# Patient Record
Sex: Female | Born: 1990 | Race: Black or African American | Hispanic: No | Marital: Single | Smoking: Never smoker
Health system: Southern US, Community
[De-identification: ages and names within clinical notes are randomized; demographics above are authoritative.]

## PROBLEM LIST (undated history)

## (undated) ENCOUNTER — Inpatient Hospital Stay (HOSPITAL_COMMUNITY): Payer: Self-pay

## (undated) ENCOUNTER — Inpatient Hospital Stay (HOSPITAL_COMMUNITY): Admission: RE | Payer: No Typology Code available for payment source | Source: Ambulatory Visit

## (undated) DIAGNOSIS — J302 Other seasonal allergic rhinitis: Secondary | ICD-10-CM

## (undated) DIAGNOSIS — R51 Headache: Secondary | ICD-10-CM

## (undated) DIAGNOSIS — K219 Gastro-esophageal reflux disease without esophagitis: Secondary | ICD-10-CM

## (undated) DIAGNOSIS — F329 Major depressive disorder, single episode, unspecified: Secondary | ICD-10-CM

## (undated) DIAGNOSIS — R519 Headache, unspecified: Secondary | ICD-10-CM

## (undated) HISTORY — PX: KNEE SURGERY: SHX244

## (undated) HISTORY — DX: Other seasonal allergic rhinitis: J30.2

---
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
## 2011-02-14 ENCOUNTER — Emergency Department (HOSPITAL_COMMUNITY)
Admission: EM | Admit: 2011-02-14 | Discharge: 2011-02-14 | Disposition: A | Discharge status: Home / Self Care | Payer: Medicaid Other | Attending: Emergency Medicine | Admitting: Emergency Medicine

## 2011-02-14 DIAGNOSIS — R42 Dizziness and giddiness: Secondary | ICD-10-CM | POA: Insufficient documentation

## 2011-02-14 DIAGNOSIS — N898 Other specified noninflammatory disorders of vagina: Secondary | ICD-10-CM | POA: Insufficient documentation

## 2011-02-14 DIAGNOSIS — R209 Unspecified disturbances of skin sensation: Secondary | ICD-10-CM | POA: Insufficient documentation

## 2011-02-14 LAB — POCT I-STAT, CHEM 8
BUN: 13 mg/dL (ref 6–23)
Creatinine, Ser: 0.8 mg/dL (ref 0.50–1.10)
Sodium: 142 mEq/L (ref 135–145)
TCO2: 24 mmol/L (ref 0–100)

## 2011-02-14 LAB — URINALYSIS, ROUTINE W REFLEX MICROSCOPIC
Bilirubin Urine: NEGATIVE
Ketones, ur: 15 mg/dL — AB
Nitrite: NEGATIVE
Protein, ur: NEGATIVE mg/dL
pH: 6 (ref 5.0–8.0)

## 2011-02-14 LAB — URINE MICROSCOPIC-ADD ON

## 2011-02-15 LAB — GC/CHLAMYDIA PROBE AMP, GENITAL
Chlamydia, DNA Probe: NEGATIVE
GC Probe Amp, Genital: NEGATIVE

## 2011-04-22 ENCOUNTER — Encounter: Payer: Self-pay | Admitting: *Deleted

## 2011-04-22 ENCOUNTER — Emergency Department (HOSPITAL_COMMUNITY)
Admission: EM | Admit: 2011-04-22 | Discharge: 2011-04-22 | Disposition: A | Discharge status: Home / Self Care | Payer: Medicaid Other | Attending: Emergency Medicine | Admitting: Emergency Medicine

## 2011-04-22 DIAGNOSIS — G8929 Other chronic pain: Secondary | ICD-10-CM | POA: Insufficient documentation

## 2011-04-22 DIAGNOSIS — T7840XA Allergy, unspecified, initial encounter: Secondary | ICD-10-CM | POA: Insufficient documentation

## 2011-04-22 DIAGNOSIS — L299 Pruritus, unspecified: Secondary | ICD-10-CM | POA: Insufficient documentation

## 2011-04-22 DIAGNOSIS — Z9889 Other specified postprocedural states: Secondary | ICD-10-CM | POA: Insufficient documentation

## 2011-04-22 DIAGNOSIS — M25569 Pain in unspecified knee: Secondary | ICD-10-CM | POA: Insufficient documentation

## 2011-04-22 DIAGNOSIS — I10 Essential (primary) hypertension: Secondary | ICD-10-CM | POA: Insufficient documentation

## 2011-04-22 DIAGNOSIS — L259 Unspecified contact dermatitis, unspecified cause: Secondary | ICD-10-CM | POA: Insufficient documentation

## 2011-04-22 MED ORDER — LORATADINE 10 MG PO TABS: 10.0000 mg | ORAL_TABLET | Freq: Every day | ORAL | Status: DC

## 2011-04-22 MED ORDER — DIPHENHYDRAMINE HCL 25 MG PO TABS: 50.0000 mg | ORAL_TABLET | ORAL | Status: DC | PRN

## 2011-04-22 MED ORDER — PREDNISONE 20 MG PO TABS: ORAL_TABLET | ORAL | Status: AC

## 2011-04-22 MED ORDER — FAMOTIDINE 20 MG PO TABS: 20.0000 mg | ORAL_TABLET | Freq: Two times a day (BID) | ORAL | Status: DC

## 2011-04-22 NOTE — Discharge Instructions (Signed)
Allergic Reaction, Mild to Moderate Allergies may happen from anything your body is sensitive to. This may be food, medications, pollens, chemicals, and nearly anything around you in everyday life that produces allergens. An allergen is anything that causes an allergy producing substance. Allergens cause your body to release allergic antibodies. Through a chain of events, they cause a release of histamine into the blood stream. Histamines are meant to protect you, but they also cause your discomfort. This is why antihistamines are often used for allergies. Heredity is often a factor in causing allergic reactions. This means you may have some of the same allergies as your parents. Allergies happen in all age groups. You may have some idea of what caused your reaction. There are many allergens around [place]. It may be difficult to know what caused your reaction. If this is a first time event, it may never happen again. Allergies cannot be cured but can be controlled with medications. SYMPTOMS  You may get some or all of the following problems from allergies.  Swelling and itching in and around the mouth.   Tearing, itchy eyes.   Nasal congestion and runny nose.   Sneezing and coughing.   An itchy red rash or hives.   Vomiting or diarrhea.   Difficulty breathing.  Seasonal allergies occur in all age groups. They are seasonal because they usually occur during the same season every year. They may be a reaction to molds, grass pollens, or tree pollens. Other causes of allergies are house dust mite allergens, pet dander and mold spores. These are just a common few of the thousands of allergens around [place]. All of the symptoms listed above happen when you come in contact with pollens and other allergens. Seasonal allergies are usually not life threatening. They are generally more of a nuisance that can often be handled using medications. Hay fever is a combination of all or some of the above listed allergy  problems. It may often be treated with simple over-the-counter medications such as diphenhydramine. Take medication as directed. Check with your caregiver or package insert for child dosages. TREATMENT AND HOME CARE INSTRUCTIONS If hives or rash are present:  Take medications as directed.   You may use an over-the-counter antihistamine (diphenhydramine) for hives and itching as needed. Do not drive or drink alcohol until medications used to treat the reaction have worn off. Antihistamines tend to make people sleepy.   Apply cold cloths (compresses) to the skin or take baths in cool water. This will help itching. Avoid hot baths or showers. Heat will make a rash and itching worse.   If your allergies persist and become more severe, and over the counter medications are not effective, there are many new medications your caretaker can prescribe. Immunotherapy or desensitizing injections can be used if all else fails. Follow up with your caregiver if problems continue.  SEEK MEDICAL CARE IF:   Your allergies are becoming progressively more troublesome.   You suspect a food allergy. Symptoms generally happen within 30 minutes of eating a food.   Your symptoms have not gone away within 2 days or are getting worse.   You develop new symptoms.   You want to retest yourself or your child with a food or drink you think causes an allergic reaction. Never test yourself or your child of a suspected allergy without being under the watchful eye of your caregivers. A second exposure to an allergen may be life-threatening.  SEEK IMMEDIATE MEDICAL CARE IF:  You  develop difficulty breathing or wheezing, or have a tight feeling in your chest or throat.   You develop a swollen mouth, hives, swelling, or itching all over your body.  A severe reaction with any of the above problems should be considered life-threatening. If you suddenly develop difficulty breathing call for local emergency medical help. THIS IS AN  EMERGENCY. MAKE SURE YOU:   Understand these instructions.   Will watch your condition.   Will get help right away if you are not doing well or get worse.  Document Released: 02/25/2007 Document Revised: 01/10/2011 Document Reviewed: 02/25/2007 [facility] Patient Information 2012 [place], [place].  Your caregiver has diagnosed you as having hives.  If you know what caused the hives, avoid that trigger. Your primary caregiver may recommend that you see an allergy specialist to determine your cause(s). SEEK MEDICAL ATTENTION IF: You still have considerable itching after taking the medication (prescribed or purchased over the counter) for 24 hours.  A temperature above 100.4 develops.  You have any pain or swelling in your joints.  Your hives last more than 1 week.  You develop new and unexplained symptoms (problems). SEEK IMMEDIATE MEDICAL ATTENTION IF: You have swollen lips or tongue.

## 2011-04-22 NOTE — ED Notes (Signed)
Pt began having a rash today after using a new soap.  Pt also would like her left knee evaluated due to pain (she had surgery to this knee in the past).  Pt is ambulatory in triage

## 2011-04-22 NOTE — ED Provider Notes (Signed)
History     CSN: [id number] Arrival date & time: 04/22/2011 12:53 AM   None     Chief Complaint  Patient presents with  . Allergic Reaction  . Knee Pain    (Consider location/radiation/quality/duration/timing/severity/associated sxs/prior treatment) HPI This 20 year old female complains of a few hours of generalized itching with some mild urticaria on her forearms which may have been precipitated by a new soap this evening. She has no tongue swelling no stridor no drooling no shortness of breath no chest pain no abdominal pain no nausea or vomiting. She has some chronic bilateral knee pain for the last couple years and will follow up with her doctor for that, that the pain is chronic and ongoing and is not a new problem tonight in all areas she has no trauma to her knees recently and no redness or swelling to her knees no weakness or numbness to her legs. There has been no treatment prior to arrival for her itching. She drove herself to the emergency department his driver self home as well. History reviewed. No pertinent past medical history.  Past Surgical History  Procedure Date  . Knee surgery     No family history on file.  History  Substance Use Topics  . Smoking status: Not on file  . Smokeless tobacco: Not on file  . Alcohol Use: No    OB History    Grav Para Term Preterm Abortions TAB SAB Ect Mult Living                  Review of Systems  Constitutional: Negative for fever.       10 Systems reviewed and are negative for acute change except as noted in the HPI.  HENT: Negative for congestion.   Eyes: Negative for discharge and redness.  Respiratory: Negative for cough and shortness of breath.   Cardiovascular: Negative for chest pain.  Gastrointestinal: Negative for vomiting and abdominal pain.  Musculoskeletal: Positive for arthralgias. Negative for back pain.  Skin: Positive for rash.  Neurological: Negative for syncope, numbness and headaches.    Psychiatric/Behavioral:       No behavior change.    Allergies  Penicillins  Home Medications   Current Outpatient Rx  Name Route Sig Dispense Refill  . DIPHENHYDRAMINE HCL 25 MG PO TABS Oral Take 2 tablets (50 mg total) by mouth every 4 (four) hours as needed for itching. 20 tablet 0  . FAMOTIDINE 20 MG PO TABS Oral Take 1 tablet (20 mg total) by mouth 2 (two) times daily. 10 tablet 0  . LORATADINE 10 MG PO TABS Oral Take 1 tablet (10 mg total) by mouth daily. 5 tablet 0  . PREDNISONE 20 MG PO TABS  2 tabs po daily x 3 days 6 tablet 0    BP 135/83  Pulse 97  Temp(Src) 98.9 F (37.2 C) (Oral)  Resp 18  SpO2 98%  Physical Exam  Nursing note and vitals reviewed. Constitutional:       Awake, alert, nontoxic appearance.  HENT:  Head: Atraumatic.  Mouth/Throat: Oropharynx is clear and moist.  Eyes: Right eye exhibits no discharge. Left eye exhibits no discharge.  Neck: Neck supple.  Pulmonary/Chest: Effort normal. She exhibits no tenderness.  Abdominal: Soft. There is no tenderness. There is no rebound.  Musculoskeletal: She exhibits tenderness. She exhibits no edema.       Baseline ROM, no obvious new focal weakness.  Both knees are mildly tender with good range of motion and negative  McMurray's and Lachman's testing and no instability with varus or valgus stress testing.  Neurological: She is alert.       Mental status and motor strength appears baseline for patient and situation.  Skin: Rash noted.       The patient has generalized pruritus but the only rash noted is some very mild urticarial plaques on both forearms without tenderness  Psychiatric: She has a normal mood and affect.    ED Course  Procedures (including critical care time)  Labs Reviewed - No data to display No results found.   1. Allergic reaction       MDM  I doubt any other EMC precluding discharge at this time including, but not necessarily limited to the following:anaphylaxis,  SBI.        Hurman Horn, MD 04/22/11 [phone]

## 2011-05-20 ENCOUNTER — Emergency Department (HOSPITAL_COMMUNITY)
Admission: EM | Admit: 2011-05-20 | Discharge: 2011-05-20 | Disposition: A | Discharge status: Home / Self Care | Payer: Self-pay | Attending: Emergency Medicine | Admitting: Emergency Medicine

## 2011-05-20 ENCOUNTER — Encounter (HOSPITAL_COMMUNITY): Payer: Self-pay | Admitting: *Deleted

## 2011-05-20 DIAGNOSIS — R109 Unspecified abdominal pain: Secondary | ICD-10-CM | POA: Insufficient documentation

## 2011-05-20 DIAGNOSIS — R300 Dysuria: Secondary | ICD-10-CM | POA: Insufficient documentation

## 2011-05-20 DIAGNOSIS — N390 Urinary tract infection, site not specified: Secondary | ICD-10-CM | POA: Insufficient documentation

## 2011-05-20 LAB — URINALYSIS, ROUTINE W REFLEX MICROSCOPIC
Bilirubin Urine: NEGATIVE
Nitrite: NEGATIVE
Specific Gravity, Urine: 1.026 (ref 1.005–1.030)
Urobilinogen, UA: 0.2 mg/dL (ref 0.0–1.0)

## 2011-05-20 LAB — URINE MICROSCOPIC-ADD ON

## 2011-05-20 MED ORDER — PHENAZOPYRIDINE HCL 100 MG PO TABS
200.0000 mg | ORAL_TABLET | Freq: Once | ORAL | Status: AC
  Administered 2011-05-20: 200 mg via ORAL
  Filled 2011-05-20: qty 2

## 2011-05-20 MED ORDER — NITROFURANTOIN MONOHYD MACRO 100 MG PO CAPS: 100.0000 mg | ORAL_CAPSULE | Freq: Two times a day (BID) | ORAL | Status: AC

## 2011-05-20 MED ORDER — NITROFURANTOIN MONOHYD MACRO 100 MG PO CAPS
100.0000 mg | ORAL_CAPSULE | ORAL | Status: AC
  Administered 2011-05-20: 100 mg via ORAL
  Filled 2011-05-20: qty 1

## 2011-05-20 MED ORDER — ONDANSETRON 4 MG PO TBDP
8.0000 mg | ORAL_TABLET | Freq: Once | ORAL | Status: AC
  Administered 2011-05-20: 8 mg via ORAL
  Filled 2011-05-20: qty 2

## 2011-05-20 MED ORDER — PHENAZOPYRIDINE HCL 200 MG PO TABS: 200.0000 mg | ORAL_TABLET | Freq: Three times a day (TID) | ORAL | Status: AC | PRN

## 2011-05-20 NOTE — ED Provider Notes (Signed)
History     CSN: [id number]  Arrival date & time 05/20/11  1538   First MD Initiated Contact with Patient 05/20/11 1704      Chief Complaint  Patient presents with  . Urinary Tract Infection    (Consider location/radiation/quality/duration/timing/severity/associated sxs/prior treatment) Patient is a 21 y.o. female presenting with female genitourinary complaint. The history is provided by the patient. No language interpreter was used.  Female GU Problem Primary symptoms include dysuria.  Primary symptoms include no discharge, no pelvic pain, no dyspareunia, no genital itching, no genital odor and no vaginal bleeding. There has been no fever. This is a new problem. The current episode started 6 to 12 hours ago. The problem occurs constantly. The problem has not changed since onset.The symptoms occur during urination and after urination. She is not pregnant. She has not missed her period. The patient's menstrual history has been regular. The discharge was normal. Associated symptoms include abdominal pain (Mild, lower). Pertinent negatives include no nausea and no vomiting. She has tried nothing for the symptoms. The treatment provided no relief. There is no concern regarding sexually transmitted diseases.    History reviewed. No pertinent past medical history.  Past Surgical History  Procedure Date  . Knee surgery     History reviewed. No pertinent family history.  History  Substance Use Topics  . Smoking status: Not on file  . Smokeless tobacco: Not on file  . Alcohol Use: No    OB History    Grav Para Term Preterm Abortions TAB SAB Ect Mult Living                  Review of Systems  Constitutional: Negative for fever and chills.  Respiratory: Negative for cough and shortness of breath.   Gastrointestinal: Positive for abdominal pain (Mild, lower). Negative for nausea, vomiting and abdominal distention.  Genitourinary: Positive for dysuria. Negative for vaginal bleeding,  pelvic pain and dyspareunia.  All other systems reviewed and are negative.    Allergies  Penicillins and Pomegranate extract  Home Medications   Current Outpatient Rx  Name Route Sig Dispense Refill  . NITROFURANTOIN MONOHYD MACRO 100 MG PO CAPS Oral Take 1 capsule (100 mg total) by mouth 2 (two) times daily. 6 capsule 0  . PHENAZOPYRIDINE HCL 200 MG PO TABS Oral Take 1 tablet (200 mg total) by mouth 3 (three) times daily as needed for pain. 9 tablet 0    BP 104/66  Pulse 75  Temp(Src) 98.7 F (37.1 C) (Oral)  Resp 20  SpO2 100%  Physical Exam  Nursing note and vitals reviewed. Constitutional: She is oriented to person, place, and time. She appears well-developed and well-nourished. No distress.  HENT:  Head: Normocephalic and atraumatic.  Eyes: EOM are normal. Pupils are equal, round, and reactive to light.  Neck: Normal range of motion. Neck supple.  Cardiovascular: Normal rate and regular rhythm.  Exam reveals no friction rub.   No murmur heard. Pulmonary/Chest: Effort normal and breath sounds normal. No respiratory distress. She has no wheezes. She has no rales.  Abdominal: Soft. She exhibits no distension. There is no tenderness. There is no rebound.  Musculoskeletal: Normal range of motion. She exhibits no edema.  Neurological: She is alert and oriented to person, place, and time.  Skin: She is not diaphoretic.    ED Course  Procedures (including critical care time)  Labs Reviewed  URINALYSIS, ROUTINE W REFLEX MICROSCOPIC - Abnormal; Notable for the following:    APPearance  CLOUDY (*)    Hgb urine dipstick SMALL (*)    Leukocytes, UA MODERATE (*)    All other components within normal limits  URINE MICROSCOPIC-ADD ON - Abnormal; Notable for the following:    Squamous Epithelial / LPF FEW (*)    Bacteria, UA FEW (*)    All other components within normal limits  POCT PREGNANCY, URINE  POCT PREGNANCY, URINE   No results found.   1. Urinary tract infection         MDM  21 year old female presenting with dysuria and urinary urgency that began this morning. Denies fevers, nausea, vomiting, vaginal bleeding or discharge. Has mild suprapubic pain. Patient is afebrile and vital signs are stable. Mild suprapubic tenderness otherwise exam is benign.  Patient has an uncomplicated cystitis. Urinary studies show bacteria and white cells in her urine. UA shows moderate leukocyte esterase. Pyridium given here in the emergency department.  Patient given Macrobid and peridium prescription to go home with. Given resources for PCP followup.   Elwin Mocha, MD 05/20/11 1729

## 2011-05-20 NOTE — ED Notes (Signed)
The pt has had urinary frequency since this am and she was at work and had to leave.  Lt lower abd pain.  lmp unknown for a long tiime

## 2011-05-20 NOTE — ED Provider Notes (Signed)
I saw and evaluated the patient, reviewed the resident's note and I agree with the findings and plan.  Aurel Nguyen P Casey Fye, MD 05/20/11 2325 

## 2011-05-20 NOTE — ED Notes (Signed)
Pt presents with 1 day hx of urinary issues with frequency, urgency, and pain. No meds pta. Alert and oriented.

## 2011-09-05 ENCOUNTER — Ambulatory Visit: Payer: Self-pay | Admitting: Family Medicine

## 2011-09-05 ENCOUNTER — Ambulatory Visit: Payer: Self-pay

## 2011-09-05 DIAGNOSIS — N390 Urinary tract infection, site not specified: Secondary | ICD-10-CM

## 2011-09-05 DIAGNOSIS — M549 Dorsalgia, unspecified: Secondary | ICD-10-CM

## 2011-09-05 DIAGNOSIS — R350 Frequency of micturition: Secondary | ICD-10-CM

## 2011-09-05 LAB — POCT UA - MICROSCOPIC ONLY
Bacteria, U Microscopic: NEGATIVE
Casts, Ur, LPF, POC: NEGATIVE
Mucus, UA: NEGATIVE

## 2011-09-05 LAB — POCT URINALYSIS DIPSTICK
Bilirubin, UA: NEGATIVE
Ketones, UA: NEGATIVE
Leukocytes, UA: NEGATIVE
Spec Grav, UA: 1.02
pH, UA: 7

## 2011-09-05 MED ORDER — TRAMADOL HCL 50 MG PO TABS: 50.0000 mg | ORAL_TABLET | Freq: Three times a day (TID) | ORAL | Status: AC | PRN

## 2011-09-05 MED ORDER — DICLOFENAC SODIUM 75 MG PO TBEC: 75.0000 mg | DELAYED_RELEASE_TABLET | Freq: Two times a day (BID) | ORAL | Status: DC

## 2011-09-05 MED ORDER — CIPROFLOXACIN HCL 250 MG PO TABS: 250.0000 mg | ORAL_TABLET | Freq: Two times a day (BID) | ORAL | Status: AC

## 2011-09-05 NOTE — Progress Notes (Signed)
21 yo woman with acute midthoracic back pain today when getting out of the shower.  No prior hx of this kind of pain.  No leg pains.  No nausea.  Pain worse with arm movement.  Also c/o urinary frequency and urinary discomfort x 2 days.  Had UTI one month ago.  O:  NAD Chest:  Clear Heart: reg, no murmur or gallop,  Normal rate. Back:  Tender left rhomboids. Results for orders placed in visit on 09/05/11  POCT URINALYSIS DIPSTICK      Component Value Range   Color, UA ywllow     Clarity, UA clear     Glucose, UA neg     Bilirubin, UA neg     Ketones, UA neg     Spec Grav, UA 1.020     Blood, UA trace-intact     pH, UA 7.0     Protein, UA 30mg      Urobilinogen, UA 0.2     Nitrite, UA neg     Leukocytes, UA Negative    POCT UA - MICROSCOPIC ONLY      Component Value Range   WBC, Ur, HPF, POC 5-15     RBC, urine, microscopic 0-2     Bacteria, U Microscopic neg     Mucus, UA neg     Epithelial cells, urine per micros 0-2     Crystals, Ur, HPF, POC neg     Casts, Ur, LPF, POC neg     Yeast, UA neg    POCT URINE PREGNANCY      Component Value Range   Preg Test, Ur         [facility] reading (PRIMARY) by  Dr. Milus Glazier CXR.  No bony or pulmonary abnormality  A:  Chest pain

## 2011-09-05 NOTE — Patient Instructions (Signed)

## 2011-09-08 LAB — URINE CULTURE: Colony Count: 45000

## 2011-11-14 ENCOUNTER — Emergency Department (HOSPITAL_COMMUNITY)
Admission: EM | Admit: 2011-11-14 | Discharge: 2011-11-14 | Disposition: A | Discharge status: Home / Self Care | Payer: Self-pay | Source: Home / Self Care | Attending: Emergency Medicine | Admitting: Emergency Medicine

## 2011-11-14 ENCOUNTER — Encounter (HOSPITAL_COMMUNITY): Payer: Self-pay | Admitting: Emergency Medicine

## 2011-11-14 DIAGNOSIS — J039 Acute tonsillitis, unspecified: Secondary | ICD-10-CM

## 2011-11-14 LAB — POCT RAPID STREP A: Streptococcus, Group A Screen (Direct): NEGATIVE

## 2011-11-14 LAB — POCT INFECTIOUS MONO SCREEN: Mono Screen: NEGATIVE

## 2011-11-14 MED ORDER — AZITHROMYCIN 500 MG PO TABS: 500.0000 mg | ORAL_TABLET | Freq: Every day | ORAL | Status: AC

## 2011-11-14 MED ORDER — PREDNISONE 5 MG PO KIT: 1.0000 | PACK | Freq: Every day | ORAL | Status: DC

## 2011-11-14 NOTE — ED Provider Notes (Signed)
Chief Complaint  Patient presents with  . Sore Throat    History of Present Illness:   The patient is a 21 year old female who has had a two-day history of sore throat, pain on swallowing, feels weak, achy, had a headache, felt feverish, chills, sweats, and had swollen glands she denies any nasal congestion, rhinorrhea, cough, nausea, or vomiting. She has no known exposure to strep or to mono.  Review of Systems:  Other than as noted above, the patient denies any of the following symptoms. Systemic:  No fever, chills, sweats, fatigue, myalgias, headache, or anorexia. Eye:  No redness, pain or drainage. ENT:  No earache, ear congestion, nasal congestion, sneezing, rhinorrhea, sinus pressure, sinus pain, or post nasal drip. Lungs:  No cough, sputum production, wheezing, shortness of breath, or chest pain. GI:  No abdominal pain, nausea, vomiting, or diarrhea. Skin:  No rash or itching.  PMFSH:  Past medical history, family history, social history, meds, allergies, and nurse's notes were reviewed.  There is no known exposure to strep or mono.  No prior history of step or mono.  The patient denies use of tobacco.  Physical Exam:   Vital signs:  BP 146/99  Pulse 120  Temp 98.5 F (36.9 C) (Oral)  Resp 22  Wt 119 lb (53.978 kg)  SpO2 98% General:  Alert, in no distress. Eye:  No conjunctival injection or drainage. Lids were normal. ENT:  TMs and canals were normal, without erythema or inflammation.  Nasal mucosa was clear and uncongested, without drainage.  Mucous membranes were moist.  Exam of pharynx reveals tonsils enlarged and red with spots of white exudate.  There were no oral ulcerations or lesions. Neck:  Supple, no adenopathy, tenderness or mass. Lungs:  No respiratory distress.  Lungs were clear to auscultation, without wheezes, rales or rhonchi.  Breath sounds were clear and equal bilaterally.  Heart:  Regular rhythm, without gallops, murmers or rubs. Skin:  Clear, warm, and dry,  without rash or lesions.  Labs:   Results for orders placed during the hospital encounter of 11/14/11  POCT RAPID STREP A ([facility] CARE ONLY)      Component Value Range   Streptococcus, Group A Screen (Direct) NEGATIVE  NEGATIVE  POCT INFECTIOUS MONO SCREEN      Component Value Range   Mono Screen NEGATIVE  NEGATIVE    Assessment:  The encounter diagnosis was Tonsillitis.  Plan:   1.  The following meds were prescribed:   New Prescriptions   AZITHROMYCIN (ZITHROMAX) 500 MG TABLET    Take 1 tablet (500 mg total) by mouth daily.   PREDNISONE 5 MG KIT    Take 1 kit (5 mg total) by mouth daily after breakfast. Prednisone 5 mg 6 day dosepack.  Take as directed.   2.  The patient was instructed in symptomatic care including hot saline gargles, throat lozenges, infectious precautions, and need to trade out toothbrush. Handouts were given. 3.  The patient was told to return if becoming worse in any way, if no better in 3 or 4 days, and given some red flag symptoms that would indicate earlier return.    Reuben Likes, MD 11/14/11 [phone]

## 2011-11-14 NOTE — ED Notes (Signed)
Sore throat pain, general body aches chest pain, headache.  Chest pain hurts with breathing and with swallowing

## 2011-12-13 ENCOUNTER — Encounter: Payer: Self-pay | Admitting: Cardiology

## 2011-12-13 ENCOUNTER — Ambulatory Visit (INDEPENDENT_AMBULATORY_CARE_PROVIDER_SITE_OTHER): Payer: BC Managed Care – PPO | Admitting: Cardiology

## 2011-12-13 VITALS — BP 108/70 | HR 91 | Ht 61.0 in | Wt 221.0 lb

## 2011-12-13 DIAGNOSIS — R42 Dizziness and giddiness: Secondary | ICD-10-CM

## 2011-12-13 DIAGNOSIS — R55 Syncope and collapse: Secondary | ICD-10-CM

## 2011-12-13 NOTE — Patient Instructions (Signed)
Stay well hydrated. Liberalize salt in your diet.  Avoid prolonged exposure to heat.   If you feel really lightheaded, lie down until symptoms pass.

## 2011-12-13 NOTE — Progress Notes (Signed)
   Domenic Polite Date of Birth: Aug 26, 1990 Medical Record #[id number]  History of Present Illness: Marbeth is a pleasant 21 year old white female seen at the request of Dr. Dallas Schimke for evaluation of syncope. She has been in excellent health. She reports that recently she was on vacation. She was in a Jacuzzi for over 15 minutes when she suddenly felt that she couldn't breathe and became lightheaded. She felt that she was going to pass out. She went to take a cold shower but then she passed out. This resulted in her hitting her knee against the tub. Since then she feels a little bit weak and mildly lightheaded. She notes that since her pregnancy 2 years ago she has had a tendency for low blood pressure with readings in the 90s systolic. She has no history of cardiac disease or murmur. She's had no prior syncopal episodes.  No current outpatient prescriptions on file prior to visit.    Allergies  Allergen Reactions  . Penicillins Other (See Comments)    Unknown from childhood  . Pomegranate Extract Hives and Itching    Past Medical History  Diagnosis Date  . Back pain   . UTI (lower urinary tract infection)   . Chest pain   . Gestational diabetes     Past Surgical History  Procedure Date  . Knee surgery     History  Smoking status  . Never Smoker   Smokeless tobacco  . Not on file    History  Alcohol Use No    History reviewed. No pertinent family history.  Review of Systems: The review of systems is positive for some headache since her fall.  All other systems were reviewed and are negative.  Physical Exam: BP 108/70  Pulse 91  Ht 5\' 1"  (1.549 m)  Wt 100.245 kg (221 lb)  BMI 41.76 kg/m2  SpO2 97% she has no orthostasis on standing She is an obese black female in no acute distress.The patient is alert and oriented x 3.  The mood and affect are normal.  The skin is warm and dry.  Color is normal.  The HEENT exam reveals that the sclera are nonicteric.  The mucous  membranes are moist.  The carotids are 2+ without bruits.  There is no thyromegaly.  There is no JVD.  The lungs are clear.  The chest wall is non tender.  The heart exam reveals a regular rate with a normal S1 and S2.  There are no murmurs, gallops, or rubs.  The PMI is not displaced.   Abdominal exam reveals good bowel sounds.  There is no guarding or rebound.  There is no hepatosplenomegaly or tenderness.  There are no masses.  Exam of the legs reveal no clubbing, cyanosis, or edema.  The legs are without rashes.  The distal pulses are intact.  Cranial nerves II - XII are intact.  Motor and sensory functions are intact.  The gait is normal.  LABORATORY DATA: ECG today shows normal sinus rhythm with a normal ECG. QT interval is normal.  Assessment / Plan:

## 2012-02-15 DIAGNOSIS — B358 Other dermatophytoses: Secondary | ICD-10-CM | POA: Insufficient documentation

## 2012-02-16 ENCOUNTER — Emergency Department (HOSPITAL_COMMUNITY)
Admission: EM | Admit: 2012-02-16 | Discharge: 2012-02-16 | Disposition: A | Discharge status: Home / Self Care | Payer: BC Managed Care – PPO | Attending: Emergency Medicine | Admitting: Emergency Medicine

## 2012-02-16 ENCOUNTER — Encounter (HOSPITAL_COMMUNITY): Payer: Self-pay | Admitting: *Deleted

## 2012-02-16 DIAGNOSIS — B356 Tinea cruris: Secondary | ICD-10-CM

## 2012-02-16 LAB — URINALYSIS, ROUTINE W REFLEX MICROSCOPIC
Bilirubin Urine: NEGATIVE
Ketones, ur: NEGATIVE mg/dL
Nitrite: NEGATIVE
Urobilinogen, UA: 0.2 mg/dL (ref 0.0–1.0)

## 2012-02-16 MED ORDER — CLOTRIMAZOLE 1 % EX CREA: TOPICAL_CREAM | CUTANEOUS | Status: DC

## 2012-02-16 NOTE — ED Notes (Addendum)
Pt reports vaginal rash and itching x1 week. Pt denies vaginal odor, d/c, or bleeding. Pt reports using OTC creams w/no relief

## 2012-02-16 NOTE — ED Notes (Signed)
MD at bedside. EP [employer]

## 2012-02-16 NOTE — ED Notes (Addendum)
C/o vaginal rash, describes as some itching, d/c, and "it hurts". First noticed 1 week ago. (Denies: nvd, fever or urinary sx).  "Thinks the rash is from her razor & not anything else, it is just irritating".

## 2012-02-16 NOTE — ED Provider Notes (Signed)
History     CSN: [id number]  Arrival date & time 02/15/12  2341   First MD Initiated Contact with Patient 02/16/12 [phone]      Chief Complaint  Patient presents with  . Rash    (Consider location/radiation/quality/duration/timing/severity/associated sxs/prior treatment) HPI Comments: 21 year old female with no significant past medical history who presents with a complaint of a rash in her groin. She states that this started approximately one week ago and was mild but has become more painful and itchy over the last week. Symptoms are persistent, nothing makes better or worse, has tried topical preparations including Vaseline which have not helped. She denies dysuria, vaginal discharge, abdominal pain, diarrhea.  Patient is a 21 y.o. female presenting with rash. The history is provided by the patient.  Rash     Past Medical History  Diagnosis Date  . Back pain   . UTI (lower urinary tract infection)   . Chest pain   . Gestational diabetes   . Low blood pressure     Past Surgical History  Procedure Date  . Knee surgery   . Knee surgery     No family history on file.  History  Substance Use Topics  . Smoking status: Never Smoker   . Smokeless tobacco: Not on file  . Alcohol Use: No    OB History    Grav Para Term Preterm Abortions TAB SAB Ect Mult Living                  Review of Systems  Constitutional: Negative for fever and chills.  Gastrointestinal: Negative for nausea, vomiting, abdominal pain and diarrhea.  Genitourinary: Positive for vaginal pain. Negative for dysuria and vaginal discharge.  Skin: Positive for rash.    Allergies  Penicillins and Pomegranate extract  Home Medications   Current Outpatient Rx  Name Route Sig Dispense Refill  . TRAMADOL HCL 50 MG PO TABS Oral Take 100 mg by mouth 2 (two) times daily as needed.    Marland Kitchen CLOTRIMAZOLE 1 % EX CREA  Apply to affected area twice daily 15 g 1    BP 145/57  Pulse 80  Temp 98.9 F (37.2 C)  (Oral)  Resp 18  SpO2 99%  Physical Exam  Nursing note and vitals reviewed. Constitutional: She appears well-developed and well-nourished. No distress.  HENT:  Head: Normocephalic and atraumatic.  Eyes: Conjunctivae normal are normal. Right eye exhibits no discharge. Left eye exhibits no discharge. No scleral icterus.  Pulmonary/Chest: Effort normal.  Abdominal: Soft. Bowel sounds are normal. She exhibits no distension and no mass. There is no tenderness.  Genitourinary:       Chaperone present for vaginal exam: Patient has erythematous pruritic, mildly scaling rash to the bilateral labium majora, tracking along the inguinal regions.  Neurological: She is alert. Coordination normal.  Skin: Skin is warm and dry. Rash noted. There is erythema.  Psychiatric: She has a normal mood and affect. Her behavior is normal.    ED Course  Procedures (including critical care time)  Labs Reviewed  URINALYSIS, ROUTINE W REFLEX MICROSCOPIC - Abnormal; Notable for the following:    Specific Gravity, Urine 1.035 (*)     All other components within normal limits   No results found.   1. Tinea cruris       MDM  The patient declines vaginal exam and STD testing stating that she will see her gynecologist next week. This rash appears to be fungal in nature, topical cream medicated, followup  with GYN.        Vida Roller, MD 02/16/12 [phone]

## 2012-02-21 ENCOUNTER — Ambulatory Visit (INDEPENDENT_AMBULATORY_CARE_PROVIDER_SITE_OTHER): Payer: BC Managed Care – PPO | Admitting: Family Medicine

## 2012-02-21 ENCOUNTER — Encounter: Payer: Self-pay | Admitting: Family Medicine

## 2012-02-21 VITALS — BP 100/78 | Temp 98.8°F | Ht 61.5 in | Wt 225.0 lb

## 2012-02-21 DIAGNOSIS — Z23 Encounter for immunization: Secondary | ICD-10-CM

## 2012-02-21 DIAGNOSIS — R739 Hyperglycemia, unspecified: Secondary | ICD-10-CM

## 2012-02-21 DIAGNOSIS — R7309 Other abnormal glucose: Secondary | ICD-10-CM

## 2012-02-21 DIAGNOSIS — E669 Obesity, unspecified: Secondary | ICD-10-CM

## 2012-02-21 LAB — BASIC METABOLIC PANEL
Chloride: 102 mEq/L (ref 96–112)
Potassium: 3.6 mEq/L (ref 3.5–5.1)
Sodium: 134 mEq/L — ABNORMAL LOW (ref 135–145)

## 2012-02-21 LAB — TSH: TSH: 1.7 u[IU]/mL (ref 0.35–5.50)

## 2012-02-21 LAB — LIPID PANEL
LDL Cholesterol: 111 mg/dL — ABNORMAL HIGH (ref 0–99)
Total CHOL/HDL Ratio: 4

## 2012-02-21 NOTE — Patient Instructions (Addendum)
-We have ordered labs or studies at this visit. It usually takes 1-2 weeks for results and processing. We will contact you with instructions IF your results are abnormal. Normal results will be released to your [facility] in 1-2 weeks. If you have not heard from [place] or can not find your results in [facility] in 2 weeks please contact our office.  -please follow diet for reflux and use TUMS if symptoms according to instructions  We recommend the following healthy lifestyle measures: - eat a healthy diet consisting of lots of vegetables, fruits, beans, nuts, seeds, healthy meats such as white chicken and fish and whole grains.  - avoid fried foods, fast food, processed foods, sodas, red meet and other fattening foods.  - get a least 150 minutes of aerobic exercise per week.    Diet for Gastroesophageal Reflux Disease, Adult Reflux (acid reflux) is when acid from your stomach flows up into the esophagus. When acid comes in contact with the esophagus, the acid causes irritation and soreness (inflammation) in the esophagus. When reflux happens often or so severely that it causes damage to the esophagus, it is called gastroesophageal reflux disease (GERD). Nutrition therapy can help ease the discomfort of GERD. FOODS OR DRINKS TO AVOID OR LIMIT  Smoking or chewing tobacco. Nicotine is one of the most potent stimulants to acid production in the gastrointestinal tract.  Caffeinated and decaffeinated coffee and black tea.  Regular or low-calorie carbonated beverages or energy drinks (caffeine-free carbonated beverages are allowed).   Strong spices, such as black pepper, white pepper, red pepper, cayenne, curry powder, and chili powder.  Peppermint or spearmint.  Chocolate.  High-fat foods, including meats and fried foods. Extra added fats including oils, butter, salad dressings, and nuts. Limit these to less than 8 tsp per day.  Fruits and vegetables if they are not tolerated, such as citrus fruits or  tomatoes.  Alcohol.  Any food that seems to aggravate your condition. If you have questions regarding your diet, call your caregiver or a registered dietitian. OTHER THINGS THAT MAY HELP GERD INCLUDE:   Eating your meals slowly, in a relaxed setting.  Eating 5 to 6 small meals per day instead of 3 large meals.  Eliminating food for a period of time if it causes distress.  Not lying down until 3 hours after eating a meal.  Keeping the head of your bed raised 6 to 9 inches (15 to 23 cm) by using a foam wedge or blocks under the legs of the bed. Lying flat may make symptoms worse.  Being physically active. Weight loss may be helpful in reducing reflux in overweight or obese adults.  Wear loose fitting clothing EXAMPLE MEAL PLAN This meal plan is approximately 2,000 calories based on https://www.bernard.org/ meal planning guidelines. Breakfast   cup cooked oatmeal.  1 cup strawberries.  1 cup low-fat milk.  1 oz almonds. Snack  1 cup cucumber slices.  6 oz yogurt (made from low-fat or fat-free milk). Lunch  2 slice whole-wheat bread.  2 oz sliced [place].  2 tsp mayonnaise.  1 cup blueberries.  1 cup snap peas. Snack  6 whole-wheat crackers.  1 oz string cheese. Dinner   cup brown rice.  1 cup mixed veggies.  1 tsp olive oil.  3 oz grilled fish. Document Released: 04/30/2005 Document Revised: 07/23/2011 Document Reviewed: 03/16/2011 [facility] Patient Information 2013 [place], [place].   Thank you for enrolling in MyChart. Please follow the instructions below to securely access your online medical  record. MyChart allows you to send messages to your doctor, view your test results, renew your prescriptions, schedule appointments, and more.  How Do I Sign Up? 1. In your Internet browser, go to [email or web address] 2. Click on the New  User? link in the Sign In box.  3. Enter your MyChart Access Code exactly as it appears below. You will not  need to use this code after you have completed the sign-up process. If you do not sign up before the expiration date, you must request a new code. MyChart Access Code: [email or web address] Expires: 03/17/2012 12:52 AM  4. Enter the last four digits of your Social Security Number (xxxx) and Date of Birth (mm/dd/yyyy) as indicated and click Next. You will be taken to the next sign-up page. 5. Create a MyChart ID. This will be your MyChart login ID and cannot be changed, so think of one that is secure and easy to remember. 6. Create a MyChart password. You can change your password at any time. 7. Enter your Password Reset Question and Answer and click Next. This can be used at a later time if you forget your password.  8. Select your communication preference, and if applicable enter your e-mail address. You will receive e-mail notification when new information is available in MyChart by choosing to receive e-mail notifications and filling in your e-mail. 9. Click Sign In. You can now view your medical record.   Additional Information If you have questions, you can email [email or web address]  [email or web address] or call [phone] to talk to our MyChart staff. Remember, MyChart is NOT to be used for urgent needs. For medical emergencies, dial 911.
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
## 2012-02-22 NOTE — Progress Notes (Signed)
Quick Note:  Left a message for return call. ______ 

## 2012-02-25 ENCOUNTER — Telehealth: Payer: Self-pay | Admitting: Family Medicine

## 2012-02-25 NOTE — Telephone Encounter (Signed)
[facility] Triage Call Report Triage Record Num: [id number] Operator: Claudie Leach Patient Name: Sandra Schultz Call Date & Time: 02/22/2012 10:36:26PM Patient Phone: [phone] PCP: Kriste Basque Patient Gender: Female PCP Fax : Patient DOB: Jul 03, 1990 [facility] Name: [facility] Reason for Call: Caller: Jackelyn/Patient; PCP: Kriste Basque (Family Practice); CB#: [phone]; Call regarding results of lab results. Caller requests results of lab results from 02/21/12. Verified in EPIC. Patient was left a message to call back to the office by Earle Gell on 02/22/12. Per note in EPIC by Dr. Selena Batten patient made aware that labs were ok and cholesterol was a little high and instructed per Dr. Elmyra Ricks note to exercise and follow a healthy diet. Caller states ok. Note sent to the office. Protocol(s) Used: Office Note Recommended Outcome per Protocol: Information Noted and Sent to Office Reason for Outcome: Caller information to office Care Advice: ~ 10/11/

## 2012-02-26 NOTE — Progress Notes (Signed)
Quick Note:  Left a message for pt to return call. ______ 

## 2012-03-25 ENCOUNTER — Encounter: Payer: BC Managed Care – PPO | Admitting: Family Medicine

## 2012-03-25 DIAGNOSIS — Z0289 Encounter for other administrative examinations: Secondary | ICD-10-CM

## 2012-03-25 NOTE — Progress Notes (Signed)
No show  This encounter was created in error - please disregard.

## 2012-03-28 ENCOUNTER — Encounter: Payer: Self-pay | Admitting: Family Medicine

## 2012-03-28 ENCOUNTER — Ambulatory Visit (INDEPENDENT_AMBULATORY_CARE_PROVIDER_SITE_OTHER): Payer: BC Managed Care – PPO | Admitting: Family Medicine

## 2012-03-28 VITALS — BP 120/80 | HR 68 | Temp 99.0°F | Wt 227.0 lb

## 2012-03-28 DIAGNOSIS — F329 Major depressive disorder, single episode, unspecified: Secondary | ICD-10-CM

## 2012-03-28 DIAGNOSIS — K219 Gastro-esophageal reflux disease without esophagitis: Secondary | ICD-10-CM

## 2012-03-28 MED ORDER — SERTRALINE HCL 50 MG PO TABS: 50.0000 mg | ORAL_TABLET | Freq: Every day | ORAL | Status: DC
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
## 2012-03-28 NOTE — Patient Instructions (Signed)
-  As we discussed, we have prescribed a new medication for you at this appointment. We discussed the common and serious potential adverse effects of this medication and you can review these and more with the pharmacist when you pick up your medication.  Please follow the instructions for use carefully and notify us immediately if you have any problems taking this medication.  -please call and schedule an appointment with the counselor  -please let us know immediately if any worsening of symptoms and see a doctor immediately if you are thinking of harming yourself

## 2012-04-22 ENCOUNTER — Encounter: Payer: BC Managed Care – PPO | Admitting: Family Medicine

## 2012-04-22 NOTE — Progress Notes (Signed)
No show  This encounter was created in error - please disregard.

## 2012-05-08 ENCOUNTER — Emergency Department (INDEPENDENT_AMBULATORY_CARE_PROVIDER_SITE_OTHER): Payer: BC Managed Care – PPO

## 2012-05-08 ENCOUNTER — Encounter (HOSPITAL_COMMUNITY): Payer: Self-pay | Admitting: *Deleted

## 2012-05-08 ENCOUNTER — Emergency Department (HOSPITAL_COMMUNITY)
Admission: EM | Admit: 2012-05-08 | Discharge: 2012-05-08 | Disposition: A | Discharge status: Home / Self Care | Payer: BC Managed Care – PPO | Source: Home / Self Care | Attending: Family Medicine | Admitting: Family Medicine

## 2012-05-08 DIAGNOSIS — S8000XA Contusion of unspecified knee, initial encounter: Secondary | ICD-10-CM

## 2012-05-08 DIAGNOSIS — S8002XA Contusion of left knee, initial encounter: Secondary | ICD-10-CM

## 2012-05-08 MED ORDER — HYDROCODONE-ACETAMINOPHEN 5-325 MG PO TABS
1.0000 | ORAL_TABLET | Freq: Once | ORAL | Status: AC
  Administered 2012-05-08: 1 via ORAL

## 2012-05-08 MED ORDER — HYDROCODONE-ACETAMINOPHEN 5-325 MG PO TABS: 1.0000 | ORAL_TABLET | Freq: Four times a day (QID) | ORAL | Status: DC | PRN

## 2012-05-08 MED ORDER — HYDROCODONE-ACETAMINOPHEN 5-325 MG PO TABS
ORAL_TABLET | ORAL | Status: AC
  Filled 2012-05-08: qty 1

## 2012-05-08 NOTE — ED Notes (Signed)
Came in with short knee splint that she wore to work. States she had to leave early due to pain.  Feels like its trying to lock.
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
## 2012-05-08 NOTE — ED Notes (Signed)
Slipped in the kitchen on the dog's water @ 0200 and landed on both knees.  She was dancing and wearing high heels when fall occurred.  L hurts worse than the R.   Had L meniscus repair 1 yr ago.

## 2012-06-03 ENCOUNTER — Inpatient Hospital Stay (HOSPITAL_COMMUNITY): Payer: BC Managed Care – PPO

## 2012-06-03 ENCOUNTER — Encounter (HOSPITAL_COMMUNITY): Payer: Self-pay

## 2012-06-03 ENCOUNTER — Inpatient Hospital Stay (HOSPITAL_COMMUNITY)
Admission: AD | Admit: 2012-06-03 | Discharge: 2012-06-03 | Disposition: A | Discharge status: Home / Self Care | Payer: BC Managed Care – PPO | Source: Ambulatory Visit | Attending: Obstetrics & Gynecology | Admitting: Obstetrics & Gynecology

## 2012-06-03 DIAGNOSIS — N946 Dysmenorrhea, unspecified: Secondary | ICD-10-CM

## 2012-06-03 DIAGNOSIS — R109 Unspecified abdominal pain: Secondary | ICD-10-CM | POA: Insufficient documentation

## 2012-06-03 HISTORY — DX: Major depressive disorder, single episode, unspecified: F32.9

## 2012-06-03 LAB — WET PREP, GENITAL
Trich, Wet Prep: NONE SEEN
Yeast Wet Prep HPF POC: NONE SEEN

## 2012-06-03 LAB — URINALYSIS, ROUTINE W REFLEX MICROSCOPIC
Bilirubin Urine: NEGATIVE
Glucose, UA: NEGATIVE mg/dL
Ketones, ur: NEGATIVE mg/dL
Leukocytes, UA: NEGATIVE
Nitrite: NEGATIVE
Protein, ur: NEGATIVE mg/dL
Specific Gravity, Urine: 1.01 (ref 1.005–1.030)
Urobilinogen, UA: 0.2 mg/dL (ref 0.0–1.0)
pH: 8 (ref 5.0–8.0)

## 2012-06-03 LAB — CBC
HCT: 37.8 % (ref 36.0–46.0)
Hemoglobin: 12.7 g/dL (ref 12.0–15.0)
MCH: 29.1 pg (ref 26.0–34.0)
MCHC: 33.6 g/dL (ref 30.0–36.0)
MCV: 86.7 fL (ref 78.0–100.0)
Platelets: 344 10*3/uL (ref 150–400)
RBC: 4.36 MIL/uL (ref 3.87–5.11)
RDW: 12.9 % (ref 11.5–15.5)
WBC: 13.8 10*3/uL — ABNORMAL HIGH (ref 4.0–10.5)

## 2012-06-03 LAB — ABO/RH: ABO/RH(D): O POS

## 2012-06-03 LAB — HCG, QUANTITATIVE, PREGNANCY: hCG, Beta Chain, Quant, S: <1 m[IU]/mL (ref <5)

## 2012-06-03 LAB — URINE MICROSCOPIC-ADD ON

## 2012-06-03 LAB — POCT PREGNANCY, URINE: Preg Test, Ur: NEGATIVE
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
## 2012-06-03 NOTE — MAU Note (Signed)
Implanon placed 3 years ago. Positive home pregnancy test 2 weeks ago. Vaginal bleeding with clots since then. Abdominal cramping x 2 weeks.

## 2012-06-03 NOTE — MAU Provider Note (Signed)
Attestation of Attending Supervision of Advanced Practitioner (CNM/NP): Evaluation and management procedures were performed by the Advanced Practitioner under my supervision and collaboration.  I have reviewed the Advanced Practitioner's note and chart, and I agree with the management and plan.  HARRAWAY-SMITH, Brahim Dolman 6:38 AM

## 2012-06-04 LAB — GC/CHLAMYDIA PROBE AMP: CT Probe RNA: POSITIVE — AB

## 2012-06-12 ENCOUNTER — Inpatient Hospital Stay (HOSPITAL_COMMUNITY)
Admission: AD | Admit: 2012-06-12 | Discharge: 2012-06-12 | Disposition: A | Discharge status: Home / Self Care | Payer: BC Managed Care – PPO | Source: Ambulatory Visit | Attending: Obstetrics & Gynecology | Admitting: Obstetrics & Gynecology

## 2012-06-12 ENCOUNTER — Encounter (HOSPITAL_COMMUNITY): Payer: Self-pay | Admitting: *Deleted

## 2012-06-12 DIAGNOSIS — N739 Female pelvic inflammatory disease, unspecified: Secondary | ICD-10-CM | POA: Insufficient documentation

## 2012-06-12 DIAGNOSIS — A5619 Other chlamydial genitourinary infection: Secondary | ICD-10-CM

## 2012-06-12 DIAGNOSIS — A749 Chlamydial infection, unspecified: Secondary | ICD-10-CM

## 2012-06-12 MED ORDER — AZITHROMYCIN 250 MG PO TABS: 1000.0000 mg | ORAL_TABLET | Freq: Once | ORAL | Status: DC
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
## 2012-06-12 NOTE — MAU Note (Signed)
PT WROTE CRAMPING ON TRIAGE  PAPER- BUT WHEN  QUESTIONED- SHE SAID SHE ONLY WROTE THAT  SO SHE COULD BE SEEN.

## 2012-06-12 NOTE — MAU Note (Signed)
PT SAYS  SHE WAS HERE ON 1-15- TOLD HAS STD  - THEY CALLED HER ON PHONE  1 WEEK AGO-  SHE IS HERE  NOW FOR RX FOR MEDS.

## 2012-06-30 ENCOUNTER — Emergency Department (HOSPITAL_COMMUNITY)
Admission: EM | Admit: 2012-06-30 | Discharge: 2012-06-30 | Disposition: A | Discharge status: Home / Self Care | Payer: BC Managed Care – PPO | Attending: Emergency Medicine | Admitting: Emergency Medicine

## 2012-06-30 ENCOUNTER — Emergency Department (HOSPITAL_COMMUNITY): Payer: BC Managed Care – PPO

## 2012-06-30 ENCOUNTER — Encounter (HOSPITAL_COMMUNITY): Payer: Self-pay | Admitting: *Deleted

## 2012-06-30 DIAGNOSIS — Z8659 Personal history of other mental and behavioral disorders: Secondary | ICD-10-CM | POA: Insufficient documentation

## 2012-06-30 DIAGNOSIS — Z8709 Personal history of other diseases of the respiratory system: Secondary | ICD-10-CM | POA: Insufficient documentation

## 2012-06-30 DIAGNOSIS — J9801 Acute bronchospasm: Secondary | ICD-10-CM

## 2012-06-30 DIAGNOSIS — Z8739 Personal history of other diseases of the musculoskeletal system and connective tissue: Secondary | ICD-10-CM | POA: Insufficient documentation

## 2012-06-30 DIAGNOSIS — Z87440 Personal history of urinary (tract) infections: Secondary | ICD-10-CM | POA: Insufficient documentation

## 2012-06-30 DIAGNOSIS — Z8679 Personal history of other diseases of the circulatory system: Secondary | ICD-10-CM | POA: Insufficient documentation

## 2012-06-30 DIAGNOSIS — J40 Bronchitis, not specified as acute or chronic: Secondary | ICD-10-CM | POA: Insufficient documentation

## 2012-06-30 DIAGNOSIS — Z8742 Personal history of other diseases of the female genital tract: Secondary | ICD-10-CM | POA: Insufficient documentation

## 2012-06-30 DIAGNOSIS — R0602 Shortness of breath: Secondary | ICD-10-CM | POA: Insufficient documentation

## 2012-06-30 MED ORDER — IPRATROPIUM BROMIDE 0.02 % IN SOLN
0.5000 mg | Freq: Once | RESPIRATORY_TRACT | Status: AC
  Administered 2012-06-30: 0.5 mg via RESPIRATORY_TRACT
  Filled 2012-06-30: qty 2.5

## 2012-06-30 MED ORDER — ALBUTEROL SULFATE HFA 108 (90 BASE) MCG/ACT IN AERS
2.0000 | INHALATION_SPRAY | RESPIRATORY_TRACT | Status: DC | PRN
  Administered 2012-06-30: 2 via RESPIRATORY_TRACT
  Filled 2012-06-30: qty 6.7

## 2012-06-30 MED ORDER — PREDNISONE 20 MG PO TABS
60.0000 mg | ORAL_TABLET | Freq: Once | ORAL | Status: AC
  Administered 2012-06-30: 60 mg via ORAL
  Filled 2012-06-30: qty 3

## 2012-06-30 MED ORDER — ALBUTEROL SULFATE (5 MG/ML) 0.5% IN NEBU
5.0000 mg | INHALATION_SOLUTION | Freq: Once | RESPIRATORY_TRACT | Status: AC
  Administered 2012-06-30: 5 mg via RESPIRATORY_TRACT
  Filled 2012-06-30: qty 1

## 2012-06-30 NOTE — ED Notes (Signed)
Pt c/o shortness of breath x 4 days with fever. Pt states fever resolved on Sunday and she began wheezing x 1 hr ago. Pt states last time she had these sxs she had pneumonia. Pt is audibly wheezing and visibly short of breath, worse when talking. Pt has dry, non-productive cough beginning Sunday a.m.
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
## 2012-08-12 ENCOUNTER — Encounter: Payer: BC Managed Care – PPO | Admitting: Family Medicine

## 2012-08-12 DIAGNOSIS — Z0289 Encounter for other administrative examinations: Secondary | ICD-10-CM

## 2012-08-12 NOTE — Progress Notes (Signed)
No show  This encounter was created in error - please disregard.

## 2012-09-01 ENCOUNTER — Encounter: Payer: Self-pay | Admitting: *Deleted

## 2012-09-07 ENCOUNTER — Emergency Department (HOSPITAL_COMMUNITY)
Admission: EM | Admit: 2012-09-07 | Discharge: 2012-09-07 | Disposition: A | Discharge status: Home / Self Care | Payer: BC Managed Care – PPO | Attending: Emergency Medicine | Admitting: Emergency Medicine

## 2012-09-07 DIAGNOSIS — Z202 Contact with and (suspected) exposure to infections with a predominantly sexual mode of transmission: Secondary | ICD-10-CM

## 2012-09-07 DIAGNOSIS — J039 Acute tonsillitis, unspecified: Secondary | ICD-10-CM

## 2012-09-07 DIAGNOSIS — Z8709 Personal history of other diseases of the respiratory system: Secondary | ICD-10-CM | POA: Insufficient documentation

## 2012-09-07 DIAGNOSIS — R509 Fever, unspecified: Secondary | ICD-10-CM | POA: Insufficient documentation

## 2012-09-07 DIAGNOSIS — Z79899 Other long term (current) drug therapy: Secondary | ICD-10-CM | POA: Insufficient documentation

## 2012-09-07 DIAGNOSIS — Z8632 Personal history of gestational diabetes: Secondary | ICD-10-CM | POA: Insufficient documentation

## 2012-09-07 DIAGNOSIS — Z880 Allergy status to penicillin: Secondary | ICD-10-CM | POA: Insufficient documentation

## 2012-09-07 DIAGNOSIS — Z8659 Personal history of other mental and behavioral disorders: Secondary | ICD-10-CM | POA: Insufficient documentation

## 2012-09-07 DIAGNOSIS — R5381 Other malaise: Secondary | ICD-10-CM | POA: Insufficient documentation

## 2012-09-07 DIAGNOSIS — R51 Headache: Secondary | ICD-10-CM | POA: Insufficient documentation

## 2012-09-07 DIAGNOSIS — R110 Nausea: Secondary | ICD-10-CM | POA: Insufficient documentation

## 2012-09-07 DIAGNOSIS — Z87440 Personal history of urinary (tract) infections: Secondary | ICD-10-CM | POA: Insufficient documentation

## 2012-09-07 LAB — RAPID STREP SCREEN (MED CTR MEBANE ONLY): Streptococcus, Group A Screen (Direct): NEGATIVE

## 2012-09-07 MED ORDER — IBUPROFEN 400 MG PO TABS
800.0000 mg | ORAL_TABLET | Freq: Once | ORAL | Status: AC
  Administered 2012-09-07: 800 mg via ORAL
  Filled 2012-09-07: qty 2

## 2012-09-07 MED ORDER — AZITHROMYCIN 250 MG PO TABS
1000.0000 mg | ORAL_TABLET | Freq: Once | ORAL | Status: AC
  Administered 2012-09-07: 1000 mg via ORAL
  Filled 2012-09-07: qty 4

## 2012-09-07 MED ORDER — LIDOCAINE HCL (PF) 1 % IJ SOLN
INTRAMUSCULAR | Status: AC
  Administered 2012-09-07: 5 mL
  Filled 2012-09-07: qty 5

## 2012-09-07 MED ORDER — IBUPROFEN 800 MG PO TABS: 800.0000 mg | ORAL_TABLET | Freq: Three times a day (TID) | ORAL | Status: DC

## 2012-09-07 MED ORDER — AZITHROMYCIN 250 MG PO TABS: ORAL_TABLET | ORAL | Status: DC

## 2012-09-07 MED ORDER — CEFTRIAXONE SODIUM 250 MG IJ SOLR
250.0000 mg | Freq: Once | INTRAMUSCULAR | Status: AC
  Administered 2012-09-07: 250 mg via INTRAMUSCULAR
  Filled 2012-09-07: qty 250

## 2012-09-07 NOTE — ED Notes (Signed)
Pt crying, stating that her boyfriend just informed her that he was dx with chlamydia.  Denies any vaginal discharge, pelvic or flank pain.

## 2012-09-07 NOTE — ED Notes (Signed)
Presents with 3 days of sore throat,feels like I am swaloowing glass, white exudate to tonsils, associated with fatigue, headache and nausea
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
## 2012-09-09 NOTE — ED Provider Notes (Signed)
Medical screening examination/treatment/procedure(s) were performed by non-physician practitioner and as supervising physician I was immediately available for consultation/collaboration.  Jemario Poitras, MD 09/09/12 0135 

## 2012-09-18 ENCOUNTER — Ambulatory Visit (INDEPENDENT_AMBULATORY_CARE_PROVIDER_SITE_OTHER): Payer: BC Managed Care – PPO | Admitting: Obstetrics & Gynecology

## 2012-09-18 ENCOUNTER — Encounter: Payer: Self-pay | Admitting: Obstetrics & Gynecology

## 2012-09-18 VITALS — BP 113/80 | HR 77 | Temp 98.4°F | Ht 62.0 in | Wt 229.2 lb

## 2012-09-18 DIAGNOSIS — Z3046 Encounter for surveillance of implantable subdermal contraceptive: Secondary | ICD-10-CM

## 2012-09-18 NOTE — Patient Instructions (Signed)
Calorie Counting Diet A calorie counting diet requires you to eat the number of calories that are right for you in a day. Calories are the measurement of how much energy you get from the food you eat. Eating the right amount of calories is important for staying at a healthy weight. If you eat too many calories, your body will store them as fat and you may gain weight. If you eat too few calories, you may lose weight. Counting the number of calories you eat during a day will help you know if you are eating the right amount. A Registered Dietitian can determine how many calories you need in a day. The amount of calories needed varies from person to person. If your goal is to lose weight, you will need to eat fewer calories. Losing weight can benefit you if you are overweight or have health problems such as heart disease, high blood pressure, or diabetes. If your goal is to gain weight, you will need to eat more calories. Gaining weight may be necessary if you have a certain health problem that causes your body to need more energy. TIPS Whether you are increasing or decreasing the number of calories you eat during a day, it may be hard to get used to changes in what you eat and drink. The following are tips to help you keep track of the number of calories you eat.  Measure foods at home with measuring cups. This helps you know the amount of food and number of calories you are eating.  Restaurants often serve food in amounts that are larger than 1 serving. While eating out, estimate how many servings of a food you are given. For example, a serving of cooked rice is  cup or about the size of half of a fist. Knowing serving sizes will help you be aware of how much food you are eating at restaurants.  Ask for smaller portion sizes or child-size portions at restaurants.  Plan to eat half of a meal at a restaurant. Take the rest home or share the other half with a friend.  Read the Nutrition Facts panel on  food labels for calorie content and serving size. You can find out how many servings are in a package, the size of a serving, and the number of calories each serving has.  For example, a package might contain 3 cookies. The Nutrition Facts panel on that package says that 1 serving is 1 cookie. Below that, it will say there are 3 servings in the container. The calories section of the Nutrition Facts label says there are 90 calories. This means there are 90 calories in 1 cookie (1 serving). If you eat 1 cookie you have eaten 90 calories. If you eat all 3 cookies, you have eaten 270 calories (3 servings x 90 calories = 270 calories). The list below tells you how big or small some common portion sizes are.  1 oz.........4 stacked dice.  3 oz.........Deck of cards.  1 tsp........Tip of little finger.  1 tbs........Thumb.  2 tbs........Golf ball.   cup.......Half of a fist.  1 cup........A fist. KEEP A FOOD LOG Write down every food item you eat, the amount you eat, and the number of calories in each food you eat during the day. At the end of the day, you can add up the total number of calories you have eaten. It may help to keep a list like the one below. Find out the calorie information by reading the   Nutrition Facts panel on food labels. Breakfast  Bran cereal (1 cup, 110 calories).  Fat-free milk ( cup, 45 calories). Snack  Apple (1 medium, 80 calories). Lunch  Spinach (1 cup, 20 calories).  Tomato ( medium, 20 calories).  Chicken breast strips (3 oz, 165 calories).  Shredded cheddar cheese ( cup, 110 calories).  Light Italian dressing (2 tbs, 60 calories).  Whole-wheat bread (1 slice, 80 calories).  Tub margarine (1 tsp, 35 calories).  Vegetable soup (1 cup, 160 calories). Dinner  Pork chop (3 oz, 190 calories).  Brown rice (1 cup, 215 calories).  Steamed broccoli ( cup, 20 calories).  Strawberries (1  cup, 65 calories).  Whipped cream (1 tbs, 50  calories). Daily Calorie Total: 1425 Document Released: 04/30/2005 Document Revised: 07/23/2011 Document Reviewed: 10/25/2006 [employer] Patient Information 2013 [employer]. Exercise to Lose Weight Exercise and a healthy diet may help you lose weight. Your doctor may suggest specific exercises. EXERCISE IDEAS AND TIPS  Choose low-cost things you enjoy doing, such as walking, bicycling, or exercising to workout videos.  Take stairs instead of the elevator.  Walk during your lunch break.  Park your car further away from work or school.  Go to a gym or an exercise class.  Start with 5 to 10 minutes of exercise each day. Build up to 30 minutes of exercise 4 to 6 days a week.  Wear shoes with good support and comfortable clothes.  Stretch before and after working out.  Work out until you breathe harder and your heart beats faster.  Drink extra water when you exercise.  Do not do so much that you hurt yourself, feel dizzy, or get very short of breath. Exercises that burn about 150 calories:  Running 1  miles in 15 minutes.  Playing volleyball for 45 to 60 minutes.  Washing and waxing a car for 45 to 60 minutes.  Playing touch football for 45 minutes.  Walking 1  miles in 35 minutes.  Pushing a stroller 1  miles in 30 minutes.  Playing basketball for 30 minutes.  Raking leaves for 30 minutes.  Bicycling 5 miles in 30 minutes.  Walking 2 miles in 30 minutes.  Dancing for 30 minutes.  Shoveling snow for 15 minutes.  Swimming laps for 20 minutes.  Walking up stairs for 15 minutes.  Bicycling 4 miles in 15 minutes.  Gardening for 30 to 45 minutes.  Jumping rope for 15 minutes.  Washing windows or floors for 45 to 60 minutes. Document Released: 06/02/2010 Document Revised: 07/23/2011 Document Reviewed: 06/02/2010 [employer] Patient Information 2013 [employer].  

## 2012-09-18 NOTE — Progress Notes (Signed)
Reasons  for removal:  Time for removal   A timeout was performed confirming the patient, the procedure and allergy status. The patient's left  arm was palpated and the implant device located. The area was prepped with Betadinex3. The distal end of the device was palpated and 3 cc of 1% lidocaine was injected. A 2 mm incision was made. Any fibrotic tissue was carefully dissected away using blunt and/or sharp dissection. The device was removed in an intact manner. The incision was closed with 2 interrupted sutures of 4-0 Vicryl.  Steri-strips and a sterile dressing were applied to the incision. The patient tolerated the procedure well.  New contraceptive method: abstinence

## 2012-09-19 ENCOUNTER — Encounter: Payer: Self-pay | Admitting: Obstetrics & Gynecology

## 2013-02-07 ENCOUNTER — Emergency Department (HOSPITAL_COMMUNITY)
Admission: EM | Admit: 2013-02-07 | Discharge: 2013-02-07 | Disposition: A | Discharge status: Home / Self Care | Payer: BC Managed Care – PPO | Attending: Emergency Medicine | Admitting: Emergency Medicine

## 2013-02-07 ENCOUNTER — Encounter (HOSPITAL_COMMUNITY): Payer: Self-pay

## 2013-02-07 DIAGNOSIS — Z79899 Other long term (current) drug therapy: Secondary | ICD-10-CM | POA: Insufficient documentation

## 2013-02-07 DIAGNOSIS — H9209 Otalgia, unspecified ear: Secondary | ICD-10-CM | POA: Insufficient documentation

## 2013-02-07 DIAGNOSIS — Z8659 Personal history of other mental and behavioral disorders: Secondary | ICD-10-CM | POA: Insufficient documentation

## 2013-02-07 DIAGNOSIS — Z880 Allergy status to penicillin: Secondary | ICD-10-CM | POA: Insufficient documentation

## 2013-02-07 DIAGNOSIS — Z8632 Personal history of gestational diabetes: Secondary | ICD-10-CM | POA: Insufficient documentation

## 2013-02-07 DIAGNOSIS — Z87440 Personal history of urinary (tract) infections: Secondary | ICD-10-CM | POA: Insufficient documentation

## 2013-02-07 DIAGNOSIS — Z87828 Personal history of other (healed) physical injury and trauma: Secondary | ICD-10-CM | POA: Insufficient documentation

## 2013-02-07 DIAGNOSIS — H9201 Otalgia, right ear: Secondary | ICD-10-CM

## 2013-02-07 MED ORDER — ANTIPYRINE-BENZOCAINE 5.4-1.4 % OT SOLN
3.0000 [drp] | OTIC | Status: DC | PRN
  Administered 2013-02-07: 3 [drp] via OTIC
  Filled 2013-02-07: qty 10

## 2013-02-07 NOTE — ED Notes (Addendum)
Pt c/o R ear pain x 2-3 days.  Pain score 8.7/10.  Denies discharge.
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
## 2013-02-07 NOTE — ED Notes (Signed)
Pt escorted to discharge window. Verbalized understanding discharge instructions. In no acute distress.   

## 2013-02-09 ENCOUNTER — Encounter: Payer: BC Managed Care – PPO | Admitting: Family Medicine

## 2013-02-09 DIAGNOSIS — Z0289 Encounter for other administrative examinations: Secondary | ICD-10-CM

## 2013-02-09 NOTE — Progress Notes (Signed)
Error   This encounter was created in error - please disregard. 

## 2013-04-15 ENCOUNTER — Emergency Department (INDEPENDENT_AMBULATORY_CARE_PROVIDER_SITE_OTHER): Payer: BC Managed Care – PPO

## 2013-04-15 ENCOUNTER — Emergency Department (INDEPENDENT_AMBULATORY_CARE_PROVIDER_SITE_OTHER)
Admission: EM | Admit: 2013-04-15 | Discharge: 2013-04-15 | Disposition: A | Discharge status: Home / Self Care | Payer: BC Managed Care – PPO | Source: Home / Self Care | Attending: Family Medicine | Admitting: Family Medicine

## 2013-04-15 ENCOUNTER — Encounter (HOSPITAL_COMMUNITY): Payer: Self-pay | Admitting: Emergency Medicine

## 2013-04-15 DIAGNOSIS — S6990XA Unspecified injury of unspecified wrist, hand and finger(s), initial encounter: Secondary | ICD-10-CM

## 2013-04-15 DIAGNOSIS — S6991XA Unspecified injury of right wrist, hand and finger(s), initial encounter: Secondary | ICD-10-CM

## 2013-04-15 MED ORDER — IBUPROFEN 800 MG PO TABS: 800.0000 mg | ORAL_TABLET | Freq: Three times a day (TID) | ORAL | Status: DC | PRN
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
## 2013-04-16 NOTE — ED Provider Notes (Signed)
Medical screening examination/treatment/procedure(s) were performed by resident physician or non-physician practitioner and as supervising physician I was immediately available for consultation/collaboration.   Barkley Bruns MD.   Linna Hoff, MD 04/16/13 2115

## 2013-04-17 NOTE — ED Notes (Signed)
Returned call from answering machine; issue resolved

## 2013-05-16 ENCOUNTER — Inpatient Hospital Stay (HOSPITAL_COMMUNITY)
Admission: EM | Admit: 2013-05-16 | Discharge: 2013-05-19 | DRG: 153 | Disposition: A | Discharge status: Home / Self Care | Payer: BC Managed Care – PPO | Attending: Internal Medicine | Admitting: Internal Medicine

## 2013-05-16 ENCOUNTER — Encounter (HOSPITAL_COMMUNITY): Payer: Self-pay | Admitting: Emergency Medicine

## 2013-05-16 DIAGNOSIS — J111 Influenza due to unidentified influenza virus with other respiratory manifestations: Principal | ICD-10-CM | POA: Diagnosis present

## 2013-05-16 DIAGNOSIS — J309 Allergic rhinitis, unspecified: Secondary | ICD-10-CM | POA: Diagnosis present

## 2013-05-16 DIAGNOSIS — R112 Nausea with vomiting, unspecified: Secondary | ICD-10-CM | POA: Diagnosis present

## 2013-05-16 DIAGNOSIS — E669 Obesity, unspecified: Secondary | ICD-10-CM | POA: Diagnosis present

## 2013-05-16 DIAGNOSIS — R69 Illness, unspecified: Secondary | ICD-10-CM

## 2013-05-16 DIAGNOSIS — Z833 Family history of diabetes mellitus: Secondary | ICD-10-CM

## 2013-05-16 DIAGNOSIS — Z6841 Body Mass Index (BMI) 40.0 and over, adult: Secondary | ICD-10-CM

## 2013-05-16 DIAGNOSIS — Z8249 Family history of ischemic heart disease and other diseases of the circulatory system: Secondary | ICD-10-CM

## 2013-05-16 DIAGNOSIS — R509 Fever, unspecified: Secondary | ICD-10-CM

## 2013-05-16 DIAGNOSIS — R000 Tachycardia, unspecified: Secondary | ICD-10-CM

## 2013-05-16 DIAGNOSIS — E860 Dehydration: Secondary | ICD-10-CM | POA: Diagnosis present

## 2013-05-16 DIAGNOSIS — I959 Hypotension, unspecified: Secondary | ICD-10-CM | POA: Diagnosis present

## 2013-05-16 DIAGNOSIS — I498 Other specified cardiac arrhythmias: Secondary | ICD-10-CM | POA: Diagnosis present

## 2013-05-16 DIAGNOSIS — K219 Gastro-esophageal reflux disease without esophagitis: Secondary | ICD-10-CM | POA: Diagnosis present

## 2013-05-16 LAB — COMPREHENSIVE METABOLIC PANEL
ALK PHOS: 108 U/L (ref 39–117)
ALT: 39 U/L — AB (ref 0–35)
AST: 31 U/L (ref 0–37)
Albumin: 4.2 g/dL (ref 3.5–5.2)
BUN: 8 mg/dL (ref 6–23)
CO2: 24 meq/L (ref 19–32)
Calcium: 9.7 mg/dL (ref 8.4–10.5)
Chloride: 98 mEq/L (ref 96–112)
Creatinine, Ser: 0.7 mg/dL (ref 0.50–1.10)
Glucose, Bld: 74 mg/dL (ref 70–99)
POTASSIUM: 3.8 meq/L (ref 3.7–5.3)
SODIUM: 139 meq/L (ref 137–147)
TOTAL PROTEIN: 8.1 g/dL (ref 6.0–8.3)
Total Bilirubin: 0.6 mg/dL (ref 0.3–1.2)

## 2013-05-16 LAB — INFLUENZA PANEL BY PCR (TYPE A & B)
H1N1FLUPCR: NOT DETECTED
INFLAPCR: NEGATIVE
INFLBPCR: NEGATIVE

## 2013-05-16 LAB — URINALYSIS, ROUTINE W REFLEX MICROSCOPIC
Bilirubin Urine: NEGATIVE
GLUCOSE, UA: NEGATIVE mg/dL
Ketones, ur: NEGATIVE mg/dL
LEUKOCYTES UA: NEGATIVE
Nitrite: NEGATIVE
Protein, ur: NEGATIVE mg/dL
SPECIFIC GRAVITY, URINE: 1.029 (ref 1.005–1.030)
Urobilinogen, UA: 0.2 mg/dL (ref 0.0–1.0)
pH: 6 (ref 5.0–8.0)

## 2013-05-16 LAB — CBC WITH DIFFERENTIAL/PLATELET
BASOS PCT: 0 % (ref 0–1)
Basophils Absolute: 0 10*3/uL (ref 0.0–0.1)
EOS ABS: 0 10*3/uL (ref 0.0–0.7)
Eosinophils Relative: 0 % (ref 0–5)
HEMATOCRIT: 41.8 % (ref 36.0–46.0)
Hemoglobin: 14.7 g/dL (ref 12.0–15.0)
Lymphocytes Relative: 12 % (ref 12–46)
Lymphs Abs: 1.9 10*3/uL (ref 0.7–4.0)
MCH: 31.3 pg (ref 26.0–34.0)
MCHC: 35.2 g/dL (ref 30.0–36.0)
MCV: 89.1 fL (ref 78.0–100.0)
MONO ABS: 0.8 10*3/uL (ref 0.1–1.0)
Monocytes Relative: 5 % (ref 3–12)
NEUTROS ABS: 13.7 10*3/uL — AB (ref 1.7–7.7)
Neutrophils Relative %: 83 % — ABNORMAL HIGH (ref 43–77)
Platelets: 285 10*3/uL (ref 150–400)
RBC: 4.69 MIL/uL (ref 3.87–5.11)
RDW: 13.2 % (ref 11.5–15.5)
WBC: 16.4 10*3/uL — ABNORMAL HIGH (ref 4.0–10.5)

## 2013-05-16 LAB — URINE MICROSCOPIC-ADD ON

## 2013-05-16 LAB — RAPID STREP SCREEN (MED CTR MEBANE ONLY): STREPTOCOCCUS, GROUP A SCREEN (DIRECT): NEGATIVE

## 2013-05-16 LAB — POCT PREGNANCY, URINE: Preg Test, Ur: NEGATIVE

## 2013-05-16 MED ORDER — SODIUM CHLORIDE 0.9 % IV BOLUS (SEPSIS)
1000.0000 mL | Freq: Once | INTRAVENOUS | Status: AC
  Administered 2013-05-16: 1000 mL via INTRAVENOUS

## 2013-05-16 MED ORDER — OSELTAMIVIR PHOSPHATE 75 MG PO CAPS
75.0000 mg | ORAL_CAPSULE | Freq: Once | ORAL | Status: AC
  Administered 2013-05-16: 75 mg via ORAL
  Filled 2013-05-16 (×2): qty 1

## 2013-05-16 MED ORDER — ONDANSETRON HCL 4 MG/2ML IJ SOLN
4.0000 mg | Freq: Once | INTRAMUSCULAR | Status: AC
  Administered 2013-05-16: 4 mg via INTRAVENOUS
  Filled 2013-05-16: qty 2

## 2013-05-16 MED ORDER — ACETAMINOPHEN 325 MG PO TABS
650.0000 mg | ORAL_TABLET | Freq: Once | ORAL | Status: AC
  Administered 2013-05-16: 650 mg via ORAL
  Filled 2013-05-16: qty 2

## 2013-05-16 MED ORDER — KETOROLAC TROMETHAMINE 30 MG/ML IJ SOLN
30.0000 mg | Freq: Once | INTRAMUSCULAR | Status: AC
  Administered 2013-05-16: 30 mg via INTRAVENOUS
  Filled 2013-05-16: qty 1

## 2013-05-16 MED ORDER — OSELTAMIVIR PHOSPHATE 75 MG PO CAPS
75.0000 mg | ORAL_CAPSULE | Freq: Two times a day (BID) | ORAL | Status: DC
  Administered 2013-05-17 – 2013-05-19 (×5): 75 mg via ORAL
  Filled 2013-05-16 (×6): qty 1

## 2013-05-16 MED ORDER — LIDOCAINE VISCOUS 2 % MT SOLN
15.0000 mL | Freq: Once | OROMUCOSAL | Status: AC
  Administered 2013-05-16: 15 mL via OROMUCOSAL
  Filled 2013-05-16: qty 15

## 2013-05-16 NOTE — ED Notes (Addendum)
MD Lockwood at bedside.  
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
## 2013-05-16 NOTE — ED Notes (Signed)
Pt reports emesis x 2. VSS.
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
## 2013-05-16 NOTE — ED Notes (Signed)
Two nurses attempted IV. IV team paged.

## 2013-05-16 NOTE — ED Notes (Signed)
Pt reports sore throat, N/V/D since last night, generalized weakness. No abdominal pain.

## 2013-05-16 NOTE — ED Notes (Signed)
IV team at bedside 

## 2013-05-16 NOTE — ED Notes (Addendum)
Pt states she woke this am with a "pounding headache," back ache, n/v, fatigue, "just not feeling well." states she has been unable to tolerate oral intake today. She had a fever which she took tylenol for approx 3 hours ago

## 2013-05-16 NOTE — ED Notes (Signed)
Md Lockwood at bedside.  

## 2013-05-17 ENCOUNTER — Encounter (HOSPITAL_COMMUNITY): Payer: Self-pay | Admitting: Emergency Medicine

## 2013-05-17 ENCOUNTER — Observation Stay (HOSPITAL_COMMUNITY): Payer: BC Managed Care – PPO

## 2013-05-17 DIAGNOSIS — R509 Fever, unspecified: Secondary | ICD-10-CM

## 2013-05-17 LAB — CULTURE, GROUP A STREP

## 2013-05-17 LAB — CBC
HCT: 38.4 % (ref 36.0–46.0)
Hemoglobin: 13 g/dL (ref 12.0–15.0)
MCH: 30.1 pg (ref 26.0–34.0)
MCHC: 33.9 g/dL (ref 30.0–36.0)
MCV: 88.9 fL (ref 78.0–100.0)
Platelets: 254 K/uL (ref 150–400)
RBC: 4.32 MIL/uL (ref 3.87–5.11)
RDW: 13.2 % (ref 11.5–15.5)
WBC: 12.1 K/uL — ABNORMAL HIGH (ref 4.0–10.5)

## 2013-05-17 LAB — BASIC METABOLIC PANEL WITH GFR
BUN: 6 mg/dL (ref 6–23)
CO2: 20 meq/L (ref 19–32)
Calcium: 8.5 mg/dL (ref 8.4–10.5)
Chloride: 106 meq/L (ref 96–112)
Creatinine, Ser: 0.62 mg/dL (ref 0.50–1.10)
GFR calc Af Amer: >90 mL/min
GFR calc non Af Amer: >90 mL/min
Glucose, Bld: 76 mg/dL (ref 70–99)
Potassium: 3.9 meq/L (ref 3.7–5.3)
Sodium: 139 meq/L (ref 137–147)

## 2013-05-17 LAB — MONONUCLEOSIS SCREEN: Mono Screen: NEGATIVE

## 2013-05-17 MED ORDER — ALUM & MAG HYDROXIDE-SIMETH 200-200-20 MG/5ML PO SUSP: 30.0000 mL | Freq: Four times a day (QID) | ORAL | Status: DC | PRN

## 2013-05-17 MED ORDER — ONDANSETRON HCL 4 MG/2ML IJ SOLN: 4.0000 mg | Freq: Three times a day (TID) | INTRAMUSCULAR | Status: DC | PRN

## 2013-05-17 MED ORDER — SODIUM CHLORIDE 0.9 % IV SOLN: INTRAVENOUS | Status: AC

## 2013-05-17 MED ORDER — ENOXAPARIN SODIUM 40 MG/0.4ML ~~LOC~~ SOLN
40.0000 mg | SUBCUTANEOUS | Status: DC
  Administered 2013-05-17 – 2013-05-19 (×3): 40 mg via SUBCUTANEOUS
  Filled 2013-05-17 (×3): qty 0.4

## 2013-05-17 MED ORDER — ALBUTEROL SULFATE (2.5 MG/3ML) 0.083% IN NEBU: 2.5000 mg | INHALATION_SOLUTION | RESPIRATORY_TRACT | Status: AC | PRN

## 2013-05-17 MED ORDER — HYDROMORPHONE HCL PF 1 MG/ML IJ SOLN
0.5000 mg | INTRAMUSCULAR | Status: DC | PRN
  Administered 2013-05-17: 1 mg via INTRAVENOUS
  Administered 2013-05-18: 0.5 mg via INTRAVENOUS
  Filled 2013-05-17 (×2): qty 1

## 2013-05-17 MED ORDER — ONDANSETRON HCL 4 MG/2ML IJ SOLN
4.0000 mg | Freq: Four times a day (QID) | INTRAMUSCULAR | Status: DC | PRN
  Administered 2013-05-17 – 2013-05-18 (×3): 4 mg via INTRAVENOUS
  Filled 2013-05-17 (×3): qty 2

## 2013-05-17 MED ORDER — OXYCODONE HCL 5 MG PO TABS
5.0000 mg | ORAL_TABLET | ORAL | Status: DC | PRN
  Administered 2013-05-17 – 2013-05-18 (×4): 5 mg via ORAL
  Filled 2013-05-17 (×4): qty 1

## 2013-05-17 MED ORDER — ZOLPIDEM TARTRATE 5 MG PO TABS: 5.0000 mg | ORAL_TABLET | Freq: Every evening | ORAL | Status: DC | PRN

## 2013-05-17 MED ORDER — ACETAMINOPHEN 650 MG RE SUPP
650.0000 mg | Freq: Four times a day (QID) | RECTAL | Status: DC | PRN
  Filled 2013-05-17: qty 1

## 2013-05-17 MED ORDER — SODIUM CHLORIDE 0.9 % IV SOLN
INTRAVENOUS | Status: DC
  Administered 2013-05-17 – 2013-05-19 (×7): via INTRAVENOUS

## 2013-05-17 MED ORDER — PHENOL 1.4 % MT LIQD
1.0000 | OROMUCOSAL | Status: DC | PRN
  Administered 2013-05-17: 12:00:00 1 via OROMUCOSAL
  Filled 2013-05-17: qty 177

## 2013-05-17 MED ORDER — ACETAMINOPHEN 325 MG PO TABS
650.0000 mg | ORAL_TABLET | Freq: Four times a day (QID) | ORAL | Status: DC | PRN
  Administered 2013-05-17 – 2013-05-19 (×2): 650 mg via ORAL
  Filled 2013-05-17 (×2): qty 2

## 2013-05-17 MED ORDER — ONDANSETRON HCL 4 MG PO TABS: 4.0000 mg | ORAL_TABLET | Freq: Four times a day (QID) | ORAL | Status: DC | PRN

## 2013-05-17 NOTE — Progress Notes (Addendum)
Received report from Casimiro NeedleMichael in ED.

## 2013-05-17 NOTE — Care Management Utilization Note (Signed)
UR complete    Hashir Deleeuw,MSN,RN [phone] 

## 2013-05-17 NOTE — Progress Notes (Signed)
[facility] PROGRESS NOTE  Domenic PoliteJessica E Eddins ZOX:[id number] DOB: 04-04-1991 DOA: 05/16/2013 PCP: Terressa KoyanagiKIM, HANNAH R., DO  Assessment/Plan: #1 influenza-like illness Patient presented with fever chills cough myalgias sore throat, several probable flu. Influenza PCR is negative. Rapid strep is negative. Monospot is negative. Repeat chest x-ray is negative for pneumonia. Patient still spiking fevers. Continue empiric Tamiflu. IV fluids. Supportive care. Follow.  #2 fevers Likely secondary to problem #1.repeat chest x-ray is negative. Urinalysis is negative. Blood cultures are pending. Patient currently on Tamiflu. Hold off on antibiotics at this time.  #3 sinus tachycardia Likely secondary to dehydration or problem #1. Continue IV fluids. Follow.  #4 hypotension Likely secondary to volume depletion. Blood pressure responding to IV fluids. Follow.  #5 nausea and vomiting Likely secondary to viral illness secondary to problem #1. Currently on clear liquids.  Continue anti-emetics. Follow.  #6 prophylaxis Lovenox for DVT prophylaxis.  Code Status: full Family Communication: updated patient no family at bedside. Disposition Plan: home when medically stable.   Consultants:  none  Procedures:  Chest x-ray 05/17/2013  Antibiotics:  Tamiflu  HPI/Subjective: Patient with myalgias and spiking fevers. Patient with sore throat.  Objective: Filed Vitals:   05/17/13 1230  BP: 112/75  Pulse: 96  Temp: 98.7 F (37.1 C)  Resp: 20    Intake/Output Summary (Last 24 hours) at 05/17/13 1425 Last data filed at 05/16/13 2227  Gross per 24 hour  Intake   3000 ml  Output      0 ml  Net   3000 ml   Filed Weights   05/16/13 1519  Weight: 99.338 kg (219 lb)    Exam:   General:  NAD  Cardiovascular: RRR  Respiratory: CTAB  Abdomen: sOFT/nt/nd/+bs  Musculoskeletal: No c/c/e   Data Reviewed: Basic Metabolic Panel:  Recent Labs Lab 05/16/13 1810 05/17/13 0430  NA 139  139  K 3.8 3.9  CL 98 106  CO2 24 20  GLUCOSE 74 76  BUN 8 6  CREATININE 0.70 0.62  CALCIUM 9.7 8.5   Liver Function Tests:  Recent Labs Lab 05/16/13 1810  AST 31  ALT 39*  ALKPHOS 108  BILITOT 0.6  PROT 8.1  ALBUMIN 4.2   No results found for this basename: LIPASE, AMYLASE,  in the last 168 hours No results found for this basename: AMMONIA,  in the last 168 hours CBC:  Recent Labs Lab 05/16/13 1810 05/17/13 0430  WBC 16.4* 12.1*  NEUTROABS 13.7*  --   HGB 14.7 13.0  HCT 41.8 38.4  MCV 89.1 88.9  PLT 285 254   Cardiac Enzymes: No results found for this basename: CKTOTAL, CKMB, CKMBINDEX, TROPONINI,  in the last 168 hours BNP (last 3 results) No results found for this basename: PROBNP,  in the last 8760 hours CBG: No results found for this basename: GLUCAP,  in the last 168 hours  Recent Results (from the past 240 hour(s))  RAPID STREP SCREEN     Status: None   Collection Time    05/16/13  4:52 PM      Result Value Range Status   Streptococcus, Group A Screen (Direct) NEGATIVE  NEGATIVE Final   Comment: (NOTE)     A Rapid Antigen test may result negative if the antigen level in the     sample is below the detection level of this test. The FDA has not     cleared this test as a stand-alone test therefore the rapid antigen     negative  result has reflexed to a Group A Strep culture.     Studies: Dg Chest 2 View  05/17/2013   CLINICAL DATA:  Body aches with fever and mid chest pain since yesterday.  EXAM: CHEST  2 VIEW  COMPARISON:  06/30/2012.  FINDINGS: The heart size and mediastinal contours are normal. The lungs are clear. There is no pleural effusion or pneumothorax. No acute osseous findings are identified. There is a moderate convex right thoracic scoliosis. Bilateral nipple rings are noted.  IMPRESSION: Stable chest.  No active cardiopulmonary process.   Electronically Signed   By: Roxy Horseman M.D.   On: 05/17/2013 09:17    Scheduled Meds: .  enoxaparin (LOVENOX) injection  40 mg Subcutaneous Q24H  . oseltamivir  75 mg Oral BID   Continuous Infusions: . sodium chloride 125 mL/hr at 05/17/13 1142    Principal Problem:   Influenza-like illness Active Problems:   GERD (gastroesophageal reflux disease)   Tachycardia   Hypotension   Flu syndrome   Influenza   Fever, unspecified    Time spent: 35 mins    [facility] MD [facility] Pager [phone]. If 7PM-7AM, please contact night-coverage at [email or web address], password [facility] 05/17/2013, 2:25 PM  LOS: 1 day
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
## 2013-05-18 LAB — BASIC METABOLIC PANEL
BUN: 5 mg/dL — ABNORMAL LOW (ref 6–23)
CALCIUM: 8.1 mg/dL — AB (ref 8.4–10.5)
CO2: 20 mEq/L (ref 19–32)
CREATININE: 0.63 mg/dL (ref 0.50–1.10)
Chloride: 103 mEq/L (ref 96–112)
GFR calc Af Amer: >90 mL/min (ref >90)
GFR calc non Af Amer: >90 mL/min (ref >90)
GLUCOSE: 75 mg/dL (ref 70–99)
Potassium: 3.6 mEq/L — ABNORMAL LOW (ref 3.7–5.3)
Sodium: 138 mEq/L (ref 137–147)

## 2013-05-18 MED ORDER — POTASSIUM CHLORIDE CRYS ER 20 MEQ PO TBCR
40.0000 meq | EXTENDED_RELEASE_TABLET | Freq: Once | ORAL | Status: DC
  Filled 2013-05-18: qty 2

## 2013-05-18 NOTE — Progress Notes (Signed)
[facility] PROGRESS NOTE  Sandra Schultz NFA:[id number] DOB: [birth year] DOA: 05/16/2013 PCP: Terressa Koyanagi., DO  Assessment/Plan: #1 influenza-like illness Patient presented with fever chills cough myalgias sore throat, several probable flu. Influenza PCR is negative. Rapid strep is negative. Monospot is negative. Repeat chest x-ray is negative for pneumonia. Patient fever curve is trending down. Continue empiric Tamiflu. IV fluids. Supportive care. Follow.  #2 fevers Likely secondary to problem #1.repeat chest x-ray is negative. Urinalysis is negative. Blood cultures are pending. Fever curve is trending down. Patient currently on Tamiflu. Hold off on antibiotics at this time.  #3 sinus tachycardia Likely secondary to dehydration or problem #1. Continue IV fluids. Follow.  #4 hypotension Likely secondary to volume depletion. Blood pressure responding to IV fluids. Follow.  #5 nausea and vomiting Likely secondary to viral illness secondary to problem #1. Currently on full liquids.  Continue anti-emetics. Advance diet to a regular diet. Follow.  #6 prophylaxis Lovenox for DVT prophylaxis.  Code Status: full Family Communication: updated patient no family at bedside. Disposition Plan: home when medically stable.   Consultants:  none  Procedures:  Chest x-ray 05/17/2013  Antibiotics:  Tamiflu  HPI/Subjective: Patient states she's starting to feel better.  Objective: Filed Vitals:   05/18/13 1351  BP: 105/69  Pulse: 86  Temp: 98.4 F (36.9 C)  Resp: 16    Intake/Output Summary (Last 24 hours) at 05/18/13 1657 Last data filed at 05/18/13 1352  Gross per 24 hour  Intake 2337.08 ml  Output      0 ml  Net 2337.08 ml   Filed Weights   05/16/13 1519  Weight: 99.338 kg (219 lb)    Exam:   General:  NAD  Cardiovascular: RRR  Respiratory: CTAB  Abdomen: sOFT/nt/nd/+bs  Musculoskeletal: No c/c/e   Data Reviewed: Basic Metabolic  Panel:  Recent Labs Lab 05/16/13 1810 05/17/13 0430 05/18/13 0610  NA 139 139 138  K 3.8 3.9 3.6*  CL 98 106 103  CO2 24 20 20   GLUCOSE 74 76 75  BUN 8 6 5*  CREATININE 0.70 0.62 0.63  CALCIUM 9.7 8.5 8.1*   Liver Function Tests:  Recent Labs Lab 05/16/13 1810  AST 31  ALT 39*  ALKPHOS 108  BILITOT 0.6  PROT 8.1  ALBUMIN 4.2   No results found for this basename: LIPASE, AMYLASE,  in the last 168 hours No results found for this basename: AMMONIA,  in the last 168 hours CBC:  Recent Labs Lab 05/16/13 1810 05/17/13 0430  WBC 16.4* 12.1*  NEUTROABS 13.7*  --   HGB 14.7 13.0  HCT 41.8 38.4  MCV 89.1 88.9  PLT 285 254   Cardiac Enzymes: No results found for this basename: CKTOTAL, CKMB, CKMBINDEX, TROPONINI,  in the last 168 hours BNP (last 3 results) No results found for this basename: PROBNP,  in the last 8760 hours CBG: No results found for this basename: GLUCAP,  in the last 168 hours  Recent Results (from the past 240 hour(s))  RAPID STREP SCREEN     Status: None   Collection Time    05/16/13  4:52 PM      Result Value Range Status   Streptococcus, Group A Screen (Direct) NEGATIVE  NEGATIVE Final   Comment: (NOTE)     A Rapid Antigen test may result negative if the antigen level in the     sample is below the detection level of this test. The FDA has not  cleared this test as a stand-alone test therefore the rapid antigen     negative result has reflexed to a Group A Strep culture.  CULTURE, GROUP A STREP     Status: None   Collection Time    05/16/13  4:52 PM      Result Value Range Status   Specimen Description THROAT   Final   Special Requests NONE   Final   Culture     Final   Value: STREPTOCOCCUS,BETA HEMOLYTIC NOT GROUP A     Performed at [facility]   Report Status 05/17/2013 FINAL   Final  CULTURE, BLOOD (ROUTINE X 2)     Status: None   Collection Time    05/17/13  1:00 PM      Result Value Range Status   Specimen  Description BLOOD LEFT HAND   Final   Special Requests BOTTLES DRAWN AEROBIC AND ANAEROBIC 10CC EACH   Final   Culture  Setup Time     Final   Value: 05/17/2013 19:29     Performed at [facility]   Culture     Final   Value:        BLOOD CULTURE RECEIVED NO GROWTH TO DATE CULTURE WILL BE HELD FOR 5 DAYS BEFORE ISSUING A FINAL NEGATIVE REPORT     Performed at [facility]   Report Status PENDING   Incomplete  CULTURE, BLOOD (ROUTINE X 2)     Status: None   Collection Time    05/17/13  1:20 PM      Result Value Range Status   Specimen Description BLOOD RIGHT HAND   Final   Special Requests BOTTLES DRAWN AEROBIC AND ANAEROBIC 10CC EACH   Final   Culture  Setup Time     Final   Value: 05/17/2013 19:29     Performed at [facility]   Culture     Final   Value:        BLOOD CULTURE RECEIVED NO GROWTH TO DATE CULTURE WILL BE HELD FOR 5 DAYS BEFORE ISSUING A FINAL NEGATIVE REPORT     Performed at [facility]   Report Status PENDING   Incomplete     Studies: Dg Chest 2 View  05/17/2013   CLINICAL DATA:  Body aches with fever and mid chest pain since yesterday.  EXAM: CHEST  2 VIEW  COMPARISON:  06/30/2012.  FINDINGS: The heart size and mediastinal contours are normal. The lungs are clear. There is no pleural effusion or pneumothorax. No acute osseous findings are identified. There is a moderate convex right thoracic scoliosis. Bilateral nipple rings are noted.  IMPRESSION: Stable chest.  No active cardiopulmonary process.   Electronically Signed   By: Roxy HorsemanBill  Veazey M.D.   On: 05/17/2013 09:17    Scheduled Meds: . enoxaparin (LOVENOX) injection  40 mg Subcutaneous Q24H  . oseltamivir  75 mg Oral BID   Continuous Infusions: . sodium chloride 75 mL/hr at 05/18/13 1628    Principal Problem:   Influenza-like illness Active Problems:   GERD (gastroesophageal reflux disease)   Tachycardia   Hypotension   Flu syndrome   Influenza   Fever,  unspecified    Time spent: 35 mins    [facility] Cl[facility]sHOMPSON,Marjorie Deprey MD [facility] Pager [phone]. If 7PM-7AM, please contact night-coverage at [email or web address], password [email or web address] 05/18/2013, 4:57 PM  LOS: 2 days

## 2013-05-19 LAB — CBC
HCT: 34.6 % — ABNORMAL LOW (ref 36.0–46.0)
Hemoglobin: 12 g/dL (ref 12.0–15.0)
MCH: 30.4 pg (ref 26.0–34.0)
MCHC: 34.7 g/dL (ref 30.0–36.0)
MCV: 87.6 fL (ref 78.0–100.0)
PLATELETS: 254 10*3/uL (ref 150–400)
RBC: 3.95 MIL/uL (ref 3.87–5.11)
RDW: 12.6 % (ref 11.5–15.5)
WBC: 9.8 10*3/uL (ref 4.0–10.5)

## 2013-05-19 LAB — BASIC METABOLIC PANEL
BUN: 5 mg/dL — ABNORMAL LOW (ref 6–23)
CALCIUM: 8.6 mg/dL (ref 8.4–10.5)
CO2: 21 mEq/L (ref 19–32)
CREATININE: 0.6 mg/dL (ref 0.50–1.10)
Chloride: 99 mEq/L (ref 96–112)
GFR calc Af Amer: >90 mL/min (ref >90)
GLUCOSE: 67 mg/dL — AB (ref 70–99)
Potassium: 3.9 mEq/L (ref 3.7–5.3)
Sodium: 137 mEq/L (ref 137–147)

## 2013-05-19 LAB — MAGNESIUM: Magnesium: 1.8 mg/dL (ref 1.5–2.5)

## 2013-05-19 MED ORDER — ONDANSETRON HCL 4 MG PO TABS: 4.0000 mg | ORAL_TABLET | Freq: Four times a day (QID) | ORAL | Status: DC | PRN

## 2013-05-19 MED ORDER — OSELTAMIVIR PHOSPHATE 75 MG PO CAPS: 75.0000 mg | ORAL_CAPSULE | Freq: Two times a day (BID) | ORAL | Status: DC

## 2013-05-19 NOTE — Care Management Note (Signed)
    Page 1 of 1   05/19/2013     4:58:36 PM   CARE MANAGEMENT NOTE 05/19/2013  Patient:  Sandra Schultz,Sandra Schultz   Account Number:  [id number]  Date Initiated:  05/17/2013  Documentation initiated by:  DAVIS,TYMEEKA  Subjective/Objective Assessment:   23 yo female admitted with influenza.     Action/Plan:   Anticipated DC Date:  05/19/2013   Anticipated DC Plan:  HOME/SELF CARE      DC Planning Services  CM consult      Choice offered to / List presented to:             Status of service:  Completed, signed off Medicare Important Message given?   (If response is "NO", the following Medicare IM given date fields will be blank) Date Medicare IM given:   Date Additional Medicare IM given:    Discharge Disposition:  HOME/SELF CARE  Per UR Regulation:  Reviewed for med. necessity/level of care/duration of stay  If discussed at Long Length of Stay Meetings, dates discussed:    Comments:  05/19/13 16:57 [name] RN, BSN [phone] patient lives alone, patient for dc today, no NCM referral no needs anticipated.  05/17/13 1352 Tymeeka Davis,MSN,RN [phone] UR complete

## 2013-05-19 NOTE — Progress Notes (Signed)
Discharge teaching given to patient.  Verbalized understanding.  No complaints of pain. IV removed.  Site clean, dry, and intact.  Catheter intact.  Pt discharged to home with md note for work.

## 2013-05-19 NOTE — Discharge Summary (Signed)
Physician Discharge Summary  Pilar JarvisJessica E XXXThomas ZOX:[id number] DOB: April 12, 1991 DOA: 05/16/2013  PCP: Kriste BasqueKIM, HANNAH R., DO  Admit date: 05/16/2013 Discharge date: 05/19/2013  Time spent: 60 minutes  Recommendations for Outpatient Follow-up:  1. Followup with Kriste BasqueKIM, HANNAH R., DO one week post discharge. Will follow patient in need a basic metabolic profile done to followup on electrolytes and renal function. Patient's influenza-like illness will need to be reassessed at that time. 2.   Discharge Diagnoses:  Principal Problem:   Influenza-like illness Active Problems:   GERD (gastroesophageal reflux disease)   Tachycardia   Hypotension   Flu syndrome   Influenza   Fever, unspecified   Discharge Condition: Stable and improved  Diet recommendation: Regular  Filed Weights   05/16/13 1519  Weight: 99.338 kg (219 lb)    History of present illness:  Sandra Schultz is a 23 y.o. female who presents to the ED with complaints of sudden onset of fevers and chills and body aches along with sore throat and nausea and vomiting since the AM. She reports feeling weak and having a poor appetite. She denies cough but reports having chest tightness, and also has a headache. She was evaluated in the ED and found to have fever tachycardia and hypotension. She was administered IVFs and studies were sent for Influenza PCR. She was given Tamiflu and referred for medical admission   Hospital Course:  #1 influenza-like illness  Patient presented with fever, chills, cough, myalgias, sore throat. Patient was admitted with concerns for the flu. Influenza PCR  was done which was negative. Rapid strep was negative. Monospot was negative. Repeat chest x-ray is negative for pneumonia.  patient was placed on Tamiflu empirically IV fluids and supportive care. Patient's fever curve trended down and patient fever had resolved by day of discharge. Patient be discharged home on 3 more days of Tamiflu and has been advised to  stay well hydrated. Patient be discharged in stable and improved condition. #2 fevers  Likely secondary to problem #1.repeat chest x-ray is negative. Urinalysis is negative. Blood cultures are pending with no growth to date . Fever curve is trending down and had resolved by day of discharge. Patient was treated empirically on Tamiflu and will be discharged home on 3 more days of Tamiflu to complete a five-day course. Patient will followup with PCP as outpatient.   #3 sinus tachycardia  Likely secondary to dehydration or problem #1.  Resolved with IV fluids. #4 hypotension  Likely secondary to volume depletion. Blood pressure responded to IV fluids. Hypotension had resolved by day of discharge. #5 nausea and vomiting  Likely secondary to viral illness secondary to problem #1.  patient was hydrated with IV fluids. Patient was initially started on clear liquids and subsequently advanced to a regular diet which she tolerated. Patient did not have any further nausea or vomiting by day of discharge. This had resolved.     Procedures:  CXR 05/17/12  Consultations:  None  Discharge Exam: Filed Vitals:   05/19/13 1401  BP: 110/56  Pulse: 68  Temp: 98.3 F (36.8 C)  Resp: 18    General: NAD Cardiovascular: RRR Respiratory: CTAB  Discharge Instructions      Discharge Orders   Future Orders Complete By Expires   Diet general  As directed    Discharge instructions  As directed    Comments:     FOLLOW UP WITH KIM, HANNAH R., DO IN 1 WEEK. STAY WELL HYDRATED.   Increase activity slowly  As directed        Medication List         ibuprofen 200 MG tablet  Commonly known as:  ADVIL,MOTRIN  Take 200 mg by mouth every 6 (six) hours as needed for fever or headache.     IMPLANON [place]  Inject into the skin.     ondansetron 4 MG tablet  Commonly known as:  ZOFRAN  Take 1 tablet (4 mg total) by mouth every 6 (six) hours as needed for nausea.     oseltamivir 75 MG capsule  Commonly  known as:  TAMIFLU  Take 1 capsule (75 mg total) by mouth 2 (two) times daily. tAKE FOR 3 DAYS THEN STOP.       Allergies  Allergen Reactions  . Penicillins Other (See Comments)    Unknown from childhood  . Pomegranate Extract Hives and Itching   Follow-up Information   Follow up with Kriste Basque R., DO. Schedule an appointment as soon as possible for a visit in 1 week.   Specialty:  Family Medicine   Contact information:   [address] [place] Flake [place] [address] [phone]        The results of significant diagnostics from this hospitalization (including imaging, microbiology, ancillary and laboratory) are listed below for reference.    Significant Diagnostic Studies: Dg Chest 2 View  05/17/2013   CLINICAL DATA:  Body aches with fever and mid chest pain since yesterday.  EXAM: CHEST  2 VIEW  COMPARISON:  06/30/2012.  FINDINGS: The heart size and mediastinal contours are normal. The lungs are clear. There is no pleural effusion or pneumothorax. No acute osseous findings are identified. There is a moderate convex right thoracic scoliosis. Bilateral nipple rings are noted.  IMPRESSION: Stable chest.  No active cardiopulmonary process.   Electronically Signed   By: Roxy Horseman M.D.   On: 05/17/2013 09:17    Microbiology: Recent Results (from the past 240 hour(s))  RAPID STREP SCREEN     Status: None   Collection Time    05/16/13  4:52 PM      Result Value Range Status   Streptococcus, Group A Screen (Direct) NEGATIVE  NEGATIVE Final   Comment: (NOTE)     A Rapid Antigen test may result negative if the antigen level in the     sample is below the detection level of this test. The FDA has not     cleared this test as a stand-alone test therefore the rapid antigen     negative result has reflexed to a Group A Strep culture.  CULTURE, GROUP A STREP     Status: None   Collection Time    05/16/13  4:52 PM      Result Value Range Status   Specimen Description THROAT   Final    Special Requests NONE   Final   Culture     Final   Value: STREPTOCOCCUS,BETA HEMOLYTIC NOT GROUP A     Performed at [facility]   Report Status 05/17/2013 FINAL   Final  CULTURE, BLOOD (ROUTINE X 2)     Status: None   Collection Time    05/17/13  1:00 PM      Result Value Range Status   Specimen Description BLOOD LEFT HAND   Final   Special Requests BOTTLES DRAWN AEROBIC AND ANAEROBIC 10CC EACH   Final   Culture  Setup Time     Final   Value: 05/17/2013 19:29  Performed at [facility]     Final   Value:        BLOOD CULTURE RECEIVED NO GROWTH TO DATE CULTURE WILL BE HELD FOR 5 DAYS BEFORE ISSUING A FINAL NEGATIVE REPORT     Performed at [facility]   Report Status PENDING   Incomplete  CULTURE, BLOOD (ROUTINE X 2)     Status: None   Collection Time    05/17/13  1:20 PM      Result Value Range Status   Specimen Description BLOOD RIGHT HAND   Final   Special Requests BOTTLES DRAWN AEROBIC AND ANAEROBIC 10CC EACH   Final   Culture  Setup Time     Final   Value: 05/17/2013 19:29     Performed at [facility]   Culture     Final   Value:        BLOOD CULTURE RECEIVED NO GROWTH TO DATE CULTURE WILL BE HELD FOR 5 DAYS BEFORE ISSUING A FINAL NEGATIVE REPORT     Performed at [facility]   Report Status PENDING   Incomplete     Labs: Basic Metabolic Panel:  Recent Labs Lab 05/16/13 1810 05/17/13 0430 05/18/13 0610 05/19/13 0432  NA 139 139 138 137  K 3.8 3.9 3.6* 3.9  CL 98 106 103 99  CO2 24 20 20 21   GLUCOSE 74 76 75 67*  BUN 8 6 5* 5*  CREATININE 0.70 0.62 0.63 0.60  CALCIUM 9.7 8.5 8.1* 8.6  MG  --   --   --  1.8   Liver Function Tests:  Recent Labs Lab 05/16/13 1810  AST 31  ALT 39*  ALKPHOS 108  BILITOT 0.6  PROT 8.1  ALBUMIN 4.2   No results found for this basename: LIPASE, AMYLASE,  in the last 168 hours No results found for this basename: AMMONIA,  in the last 168 hours CBC:  Recent  Labs Lab 05/16/13 1810 05/17/13 0430 05/19/13 0432  WBC 16.4* 12.1* 9.8  NEUTROABS 13.7*  --   --   HGB 14.7 13.0 12.0  HCT 41.8 38.4 34.6*  MCV 89.1 88.9 87.6  PLT 285 254 254   Cardiac Enzymes: No results found for this basename: CKTOTAL, CKMB, CKMBINDEX, TROPONINI,  in the last 168 hours BNP: BNP (last 3 results) No results found for this basename: PROBNP,  in the last 8760 hours CBG: No results found for this basename: GLUCAP,  in the last 168 hours     Signed:  [facility] MD [facility] 05/19/2013, 4:23 PM

## 2013-05-23 LAB — CULTURE, BLOOD (ROUTINE X 2)
Culture: NO GROWTH
Culture: NO GROWTH

## 2013-06-01 ENCOUNTER — Encounter: Payer: Self-pay | Admitting: Family Medicine

## 2013-06-01 ENCOUNTER — Ambulatory Visit (INDEPENDENT_AMBULATORY_CARE_PROVIDER_SITE_OTHER): Payer: BC Managed Care – PPO | Admitting: Family Medicine

## 2013-06-01 VITALS — BP 100/76 | Temp 98.5°F | Wt 216.0 lb

## 2013-06-01 DIAGNOSIS — R112 Nausea with vomiting, unspecified: Secondary | ICD-10-CM

## 2013-06-01 DIAGNOSIS — J111 Influenza due to unidentified influenza virus with other respiratory manifestations: Secondary | ICD-10-CM

## 2013-06-01 DIAGNOSIS — H109 Unspecified conjunctivitis: Secondary | ICD-10-CM

## 2013-06-01 MED ORDER — SULFACETAMIDE SODIUM 10 % OP SOLN: 1.0000 [drp] | OPHTHALMIC | Status: DC

## 2013-06-01 NOTE — Patient Instructions (Addendum)
FOR eyes: -cool compresses, artificial tears and eye drops - see eye doctor if worsens or persists  FOR Nausea and Vomiting: -We placed a referral for you as discussed. It usually takes about 1-2 weeks to process and schedule this referral. If you have not heard from us regarding this appointment in 2 weeks please contact our office. -take prilosec 20mg  daily -avoid dairy  Follow up as needed and in 2 months

## 2013-06-01 NOTE — Progress Notes (Signed)
Pre visit review using our clinic review tool, if applicable. No additional management support is needed unless otherwise documented below in the visit note. 
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
## 2013-06-12 ENCOUNTER — Encounter: Payer: Self-pay | Admitting: Internal Medicine

## 2013-07-06 ENCOUNTER — Ambulatory Visit (INDEPENDENT_AMBULATORY_CARE_PROVIDER_SITE_OTHER): Payer: BC Managed Care – PPO | Admitting: Family Medicine

## 2013-07-06 ENCOUNTER — Encounter: Payer: Self-pay | Admitting: Family Medicine

## 2013-07-06 VITALS — BP 120/70 | Temp 99.5°F | Wt 214.0 lb

## 2013-07-06 DIAGNOSIS — L680 Hirsutism: Secondary | ICD-10-CM

## 2013-07-06 DIAGNOSIS — L659 Nonscarring hair loss, unspecified: Secondary | ICD-10-CM

## 2013-07-06 DIAGNOSIS — R300 Dysuria: Secondary | ICD-10-CM

## 2013-07-06 DIAGNOSIS — L689 Hypertrichosis, unspecified: Secondary | ICD-10-CM

## 2013-07-06 LAB — POCT URINALYSIS DIPSTICK
GLUCOSE UA: NEGATIVE
Nitrite, UA: NEGATIVE
PH UA: 6
SPEC GRAV UA: 1.025
UROBILINOGEN UA: 0.2

## 2013-07-06 MED ORDER — NITROFURANTOIN MONOHYD MACRO 100 MG PO CAPS: 100.0000 mg | ORAL_CAPSULE | Freq: Two times a day (BID) | ORAL | Status: DC

## 2013-07-06 NOTE — Patient Instructions (Signed)
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
## 2013-07-06 NOTE — Addendum Note (Signed)
Addended by: Aliayah Tyer C on: 07/06/2013 04:47 PM   Modules accepted: Orders  

## 2013-07-06 NOTE — Progress Notes (Signed)
Pre visit review using our clinic review tool, if applicable. No additional management support is needed unless otherwise documented below in the visit note. 

## 2013-07-08 LAB — URINE CULTURE

## 2013-07-13 ENCOUNTER — Encounter: Payer: Self-pay | Admitting: Internal Medicine

## 2013-07-14 ENCOUNTER — Ambulatory Visit: Payer: BC Managed Care – PPO | Admitting: Internal Medicine

## 2013-07-28 ENCOUNTER — Other Ambulatory Visit: Payer: BC Managed Care – PPO

## 2013-08-10 ENCOUNTER — Other Ambulatory Visit (INDEPENDENT_AMBULATORY_CARE_PROVIDER_SITE_OTHER): Payer: BC Managed Care – PPO

## 2013-08-10 ENCOUNTER — Ambulatory Visit (INDEPENDENT_AMBULATORY_CARE_PROVIDER_SITE_OTHER): Payer: BC Managed Care – PPO | Admitting: Family Medicine

## 2013-08-10 ENCOUNTER — Encounter: Payer: Self-pay | Admitting: Family Medicine

## 2013-08-10 VITALS — BP 110/70 | Temp 98.5°F | Wt 205.0 lb

## 2013-08-10 DIAGNOSIS — IMO0001 Reserved for inherently not codable concepts without codable children: Secondary | ICD-10-CM

## 2013-08-10 DIAGNOSIS — E669 Obesity, unspecified: Secondary | ICD-10-CM

## 2013-08-10 DIAGNOSIS — Z309 Encounter for contraceptive management, unspecified: Secondary | ICD-10-CM

## 2013-08-10 DIAGNOSIS — E039 Hypothyroidism, unspecified: Secondary | ICD-10-CM

## 2013-08-10 DIAGNOSIS — N926 Irregular menstruation, unspecified: Secondary | ICD-10-CM

## 2013-08-10 LAB — TSH: TSH: 1.49 u[IU]/mL (ref 0.35–5.50)

## 2013-08-10 MED ORDER — NORGESTIMATE-ETH ESTRADIOL 0.25-35 MG-MCG PO TABS: 1.0000 | ORAL_TABLET | Freq: Every day | ORAL | Status: DC

## 2013-08-10 MED ORDER — ORLISTAT 120 MG PO CAPS: 120.0000 mg | ORAL_CAPSULE | Freq: Three times a day (TID) | ORAL | Status: DC

## 2013-08-10 NOTE — Progress Notes (Signed)
Pre visit review using our clinic review tool, if applicable. No additional management support is needed unless otherwise documented below in the visit note. 
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
## 2013-08-22 ENCOUNTER — Emergency Department (INDEPENDENT_AMBULATORY_CARE_PROVIDER_SITE_OTHER)
Admission: EM | Admit: 2013-08-22 | Discharge: 2013-08-22 | Disposition: A | Discharge status: Home / Self Care | Payer: BC Managed Care – PPO | Source: Home / Self Care | Attending: Family Medicine | Admitting: Family Medicine

## 2013-08-22 ENCOUNTER — Other Ambulatory Visit (HOSPITAL_COMMUNITY)
Admission: RE | Admit: 2013-08-22 | Discharge: 2013-08-22 | Disposition: A | Discharge status: Home / Self Care | Payer: BC Managed Care – PPO | Source: Ambulatory Visit | Attending: Family Medicine | Admitting: Family Medicine

## 2013-08-22 ENCOUNTER — Encounter (HOSPITAL_COMMUNITY): Payer: Self-pay | Admitting: Emergency Medicine

## 2013-08-22 DIAGNOSIS — N760 Acute vaginitis: Secondary | ICD-10-CM | POA: Insufficient documentation

## 2013-08-22 DIAGNOSIS — R102 Pelvic and perineal pain: Secondary | ICD-10-CM

## 2013-08-22 DIAGNOSIS — Z113 Encounter for screening for infections with a predominantly sexual mode of transmission: Secondary | ICD-10-CM | POA: Insufficient documentation

## 2013-08-22 DIAGNOSIS — N949 Unspecified condition associated with female genital organs and menstrual cycle: Secondary | ICD-10-CM

## 2013-08-22 LAB — POCT URINALYSIS DIP (DEVICE)
BILIRUBIN URINE: NEGATIVE
Glucose, UA: NEGATIVE mg/dL
Ketones, ur: NEGATIVE mg/dL
NITRITE: NEGATIVE
Protein, ur: NEGATIVE mg/dL
Specific Gravity, Urine: >=1.03 (ref 1.005–1.030)
UROBILINOGEN UA: 0.2 mg/dL (ref 0.0–1.0)
pH: 6.5 (ref 5.0–8.0)

## 2013-08-22 LAB — POCT PREGNANCY, URINE: PREG TEST UR: NEGATIVE

## 2013-08-22 MED ORDER — CIPROFLOXACIN HCL 500 MG PO TABS: 500.0000 mg | ORAL_TABLET | Freq: Two times a day (BID) | ORAL | Status: DC

## 2013-08-22 NOTE — ED Notes (Signed)
pT  REPORTS  L  LQ  PAIN THAT  SHE REPORTS  SHE  HAS  HAD    IN  VARYING  DEGREES  FOR   SEV  WEEKS   -  SEEN BY   pcp    10  Days  Ago and  Was told  Probably  Had  Gas     -  Symptoms  Getting  Worse  Over  Last  sev  Days  Pt  Ambulated  To  Room with a  Steady  Fluid  Gait       Sitting  Upright on exam  Table  In       No     - had      bm   Today       denys  Any  Vaginal bleeding or  Discharge

## 2013-08-22 NOTE — Discharge Instructions (Signed)
Thank you for coming in today. You may have a UTI. Please take Cipro twice daily for 5 days. If not better I recommend a pelvic ultrasound. If your belly pain worsens, or you have high fever, bad vomiting, blood in your stool or black tarry stool go to the Emergency Room.  Pelvic Pain, Female Female pelvic pain can be caused by many different things and start from a variety of places. Pelvic pain refers to pain that is located in the lower half of the abdomen and between your hips. The pain may occur over a short period of time (acute) or may be reoccurring (chronic). The cause of pelvic pain may be related to disorders affecting the female reproductive organs (gynecologic), but it may also be related to the bladder, kidney stones, an intestinal complication, or muscle or skeletal problems. Getting help right away for pelvic pain is important, especially if there has been severe, sharp, or a sudden onset of unusual pain. It is also important to get help right away because some types of pelvic pain can be life threatening.  CAUSES  Below are only some of the causes of pelvic pain. The causes of pelvic pain can be in one of several categories.   Gynecologic.  Pelvic inflammatory disease.  Sexually transmitted infection.  Ovarian cyst or a twisted ovarian ligament (ovarian torsion).  Uterine lining that grows outside the uterus (endometriosis).  Fibroids, cysts, or tumors.  Ovulation.  Pregnancy.  Pregnancy that occurs outside the uterus (ectopic pregnancy).  Miscarriage.  Labor.  Abruption of the placenta or ruptured uterus.  Infection.  Uterine infection (endometritis).  Bladder infection.  Diverticulitis.  Miscarriage related to a uterine infection (septic abortion).  Bladder.  Inflammation of the bladder (cystitis).  Kidney stone(s).  Gastrointenstinal.  Constipation.  Diverticulitis.  Neurologic.  Trauma.  Feeling pelvic pain because of mental or  emotional causes (psychosomatic).  Cancers of the bowel or pelvis. EVALUATION  Your caregiver will want to take a careful history of your concerns. This includes recent changes in your health, a careful gynecologic history of your periods (menses), and a sexual history. Obtaining your family history and medical history is also important. Your caregiver may suggest a pelvic exam. A pelvic exam will help identify the location and severity of the pain. It also helps in the evaluation of which organ system may be involved. In order to identify the cause of the pelvic pain and be properly treated, your caregiver may order tests. These tests may include:   A pregnancy test.  Pelvic ultrasonography.  An X-ray exam of the abdomen.  A urinalysis or evaluation of vaginal discharge.  Blood tests. HOME CARE INSTRUCTIONS   Only take over-the-counter or prescription medicines for pain, discomfort, or fever as directed by your caregiver.   Rest as directed by your caregiver.   Eat a balanced diet.   Drink enough fluids to make your urine clear or pale yellow, or as directed.   Avoid sexual intercourse if it causes pain.   Apply warm or cold compresses to the lower abdomen depending on which one helps the pain.   Avoid stressful situations.   Keep a journal of your pelvic pain. Write down when it started, where the pain is located, and if there are things that seem to be associated with the pain, such as food or your menstrual cycle.  Follow up with your caregiver as directed.  SEEK MEDICAL CARE IF:  Your medicine does not help your pain.  You  have abnormal vaginal discharge. SEEK IMMEDIATE MEDICAL CARE IF:   You have heavy bleeding from the vagina.   Your pelvic pain increases.   You feel lightheaded or faint.   You have chills.   You have pain with urination or blood in your urine.   You have uncontrolled diarrhea or vomiting.   You have a fever or persistent  symptoms for more than 3 days.  You have a fever and your symptoms suddenly get worse.   You are being physically or sexually abused.  MAKE SURE YOU:  Understand these instructions.  Will watch your condition.  Will get help if you are not doing well or get worse. Document Released: 03/27/2004 Document Revised: 10/30/2011 Document Reviewed: 08/20/2011 [facility] Patient Information 2014 [place], [place].
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
## 2013-08-24 LAB — URINE CULTURE: Colony Count: 2000

## 2013-08-24 LAB — CERVICOVAGINAL ANCILLARY ONLY
Chlamydia: POSITIVE — AB
NEISSERIA GONORRHEA: NEGATIVE
WET PREP (BD AFFIRM): NEGATIVE
Wet Prep (BD Affirm): NEGATIVE
Wet Prep (BD Affirm): POSITIVE — AB

## 2013-08-24 NOTE — ED Notes (Signed)
GC neg., Chlamydia pos., Affirm: Candida and Trich neg., Gardnerella pos., Urine culture: Insignificant growth.  Message sent to Dr. Denyse Amassorey. Desiree LucySuzanne M [facility] 08/24/2013

## 2013-08-25 ENCOUNTER — Telehealth (HOSPITAL_COMMUNITY): Payer: Self-pay | Admitting: Family Medicine

## 2013-08-25 MED ORDER — METRONIDAZOLE 500 MG PO TABS: 500.0000 mg | ORAL_TABLET | Freq: Two times a day (BID) | ORAL | Status: DC

## 2013-08-25 MED ORDER — AZITHROMYCIN 250 MG PO TABS: 1000.0000 mg | ORAL_TABLET | Freq: Once | ORAL | Status: DC

## 2013-08-25 NOTE — ED Notes (Signed)
Pt positive for chlamydia and BV.  Tx with Flagyl and Azithromycin.  Recommend HIV and RPR in near future.  RN to contact pt.   Rodolph BongEvan S Corey, MD 08/25/13 [phone]

## 2013-08-25 NOTE — Telephone Encounter (Signed)
Message copied by Rodolph BongOREY, Kerrianne Jeng S on Tue Aug 25, 2013  4:41 PM ------      Message from: Vassie MoselleYORK, SUZANNE M      Created: Mon Aug 24, 2013  7:50 PM      Regarding: labs       Chlamydia and Gardnerella pos.  Urine culture: Insignificant growth, rest of labs neg.      Desiree LucySuzanne M [place]      08/24/2013. ------

## 2013-08-26 ENCOUNTER — Telehealth (HOSPITAL_COMMUNITY): Payer: Self-pay | Admitting: *Deleted

## 2013-08-27 NOTE — ED Notes (Addendum)
Pt. called back.  Pt. verified x 2 and given results.  Pt. Told she needs Zithromax for Chlamydia and Flagyl for bacterial vaginosis.  Pt. instructed to notify her partner, no sex for 1 week after the medication and to practice safe sex. Pt. [facility], by appointment.  Pt. instructed to no alcohol while taking the Flagyl.  Pt.told where to pick up her Rx.'s.  Pt. voiced understanding. Vassie MoselleSuzanne M Travion Ke 08/27/2013 DHHS [facility]. 09/06/2013

## 2013-10-01 ENCOUNTER — Encounter: Payer: Self-pay | Admitting: Physician Assistant

## 2013-10-01 ENCOUNTER — Ambulatory Visit (INDEPENDENT_AMBULATORY_CARE_PROVIDER_SITE_OTHER): Payer: BC Managed Care – PPO | Admitting: Physician Assistant

## 2013-10-01 VITALS — BP 120/80 | HR 84 | Temp 98.4°F | Resp 18 | Wt 206.0 lb

## 2013-10-01 DIAGNOSIS — R300 Dysuria: Secondary | ICD-10-CM

## 2013-10-01 DIAGNOSIS — N9489 Other specified conditions associated with female genital organs and menstrual cycle: Secondary | ICD-10-CM

## 2013-10-01 DIAGNOSIS — N390 Urinary tract infection, site not specified: Secondary | ICD-10-CM

## 2013-10-01 DIAGNOSIS — A749 Chlamydial infection, unspecified: Secondary | ICD-10-CM

## 2013-10-01 LAB — POCT URINALYSIS DIPSTICK
BILIRUBIN UA: NEGATIVE
GLUCOSE UA: NEGATIVE
KETONES UA: NEGATIVE
NITRITE UA: NEGATIVE
Spec Grav, UA: >=1.03
Urobilinogen, UA: 0.2
pH, UA: 7

## 2013-10-01 MED ORDER — CEFTRIAXONE SODIUM 250 MG IJ SOLR: 250.0000 mg | Freq: Once | INTRAMUSCULAR | Status: DC

## 2013-10-01 MED ORDER — DOXYCYCLINE HYCLATE 100 MG PO TABS: 100.0000 mg | ORAL_TABLET | Freq: Two times a day (BID) | ORAL | Status: DC

## 2013-10-01 MED ORDER — CEFTRIAXONE SODIUM 500 MG IJ SOLR
250.0000 mg | Freq: Once | INTRAMUSCULAR | Status: AC
  Administered 2013-10-01: 250 mg via INTRAMUSCULAR

## 2013-10-01 NOTE — Patient Instructions (Signed)
You will be called to schedule an appointment for your ultrasound. You will have both a transabdominal and a transvaginal ultrasound to evaluate your uterus and your ovaries. We will call you with the results of these studies when they are available.  You'll be called to schedule an appointment with urology.  Doxycycline 2 pills a day for 10 days to treat Chlamydia.  Followup in 2 weeks to reassess and to make sure that your appointments have been made with urology and the ultrasound.  Followup sooner symptoms worsen or fail to improve despite treatment.  Urinary Tract Infection A urinary tract infection (UTI) can occur any place along the urinary tract. The tract includes the kidneys, ureters, bladder, and urethra. A type of germ called bacteria often causes a UTI. UTIs are often helped with antibiotic medicine.  HOME CARE   If given, take antibiotics as told by your doctor. Finish them even if you start to feel better.  Drink enough fluids to keep your pee (urine) clear or pale yellow.  Avoid tea, drinks with caffeine, and bubbly (carbonated) drinks.  Pee often. Avoid holding your pee in for a long time.  Pee before and after having sex (intercourse).  Wipe from front to back after you poop (bowel movement) if you are a woman. Use each tissue only once. GET HELP RIGHT AWAY IF:   You have back pain.  You have lower belly (abdominal) pain.  You have chills.  You feel sick to your stomach (nauseous).  You throw up (vomit).  Your burning or discomfort with peeing does not go away.  You have a fever.  Your symptoms are not better in 3 days. MAKE SURE YOU:   Understand these instructions.  Will watch your condition.  Will get help right away if you are not doing well or get worse. Document Released: 10/17/2007 Document Revised: 01/23/2012 Document Reviewed: 11/29/2011 [facility] Patient Information 2014 [place].
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
## 2013-10-01 NOTE — Progress Notes (Signed)
Pre visit review using our clinic review tool, if applicable. No additional management support is needed unless otherwise documented below in the visit note. 

## 2013-10-04 LAB — URINE CULTURE: Colony Count: >=100000

## 2013-10-06 ENCOUNTER — Ambulatory Visit
Admission: RE | Admit: 2013-10-06 | Discharge: 2013-10-06 | Disposition: A | Discharge status: Home / Self Care | Payer: BC Managed Care – PPO | Source: Ambulatory Visit | Attending: Physician Assistant | Admitting: Physician Assistant

## 2013-10-06 DIAGNOSIS — N9489 Other specified conditions associated with female genital organs and menstrual cycle: Secondary | ICD-10-CM

## 2013-10-06 DIAGNOSIS — A749 Chlamydial infection, unspecified: Secondary | ICD-10-CM

## 2013-10-12 ENCOUNTER — Ambulatory Visit: Payer: BC Managed Care – PPO | Admitting: Physician Assistant

## 2013-10-13 ENCOUNTER — Encounter: Payer: Self-pay | Admitting: Physician Assistant

## 2013-10-13 ENCOUNTER — Ambulatory Visit (INDEPENDENT_AMBULATORY_CARE_PROVIDER_SITE_OTHER): Payer: BC Managed Care – PPO | Admitting: Physician Assistant

## 2013-10-13 VITALS — BP 100/70 | Temp 98.7°F | Wt 208.0 lb

## 2013-10-13 DIAGNOSIS — Z09 Encounter for follow-up examination after completed treatment for conditions other than malignant neoplasm: Secondary | ICD-10-CM

## 2013-10-13 DIAGNOSIS — R300 Dysuria: Secondary | ICD-10-CM

## 2013-10-13 NOTE — Progress Notes (Signed)
Pre visit review using our clinic review tool, if applicable. No additional management support is needed unless otherwise documented below in the visit note. 
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
## 2013-10-13 NOTE — Patient Instructions (Signed)
Keep your appointment with urologist to evaluate your recurrent UTIs.  Followup as needed.   Health Maintenance, Female A healthy lifestyle and preventative care can promote health and wellness.  Maintain regular health, dental, and eye exams.  Eat a healthy diet. Foods like vegetables, fruits, whole grains, low-fat dairy products, and lean protein foods contain the nutrients you need without too many calories. Decrease your intake of foods high in solid fats, added sugars, and salt. Get information about a proper diet from your caregiver, if necessary.  Regular physical exercise is one of the most important things you can do for your health. Most adults should get at least 150 minutes of moderate-intensity exercise (any activity that increases your heart rate and causes you to sweat) each week. In addition, most adults need muscle-strengthening exercises on 2 or more days a week.   Maintain a healthy weight. The body mass index (BMI) is a screening tool to identify possible weight problems. It provides an estimate of body fat based on height and weight. Your caregiver can help determine your BMI, and can help you achieve or maintain a healthy weight. For adults 20 years and older:  A BMI below 18.5 is considered underweight.  A BMI of 18.5 to 24.9 is normal.  A BMI of 25 to 29.9 is considered overweight.  A BMI of 30 and above is considered obese.  Maintain normal blood lipids and cholesterol by exercising and minimizing your intake of saturated fat. Eat a balanced diet with plenty of fruits and vegetables. Blood tests for lipids and cholesterol should begin at age 69 and be repeated every 5 years. If your lipid or cholesterol levels are high, you are over 50, or you are a high risk for heart disease, you may need your cholesterol levels checked more frequently.Ongoing high lipid and cholesterol levels should be treated with medicines if diet and exercise are not effective.  If you  smoke, find out from your caregiver how to quit. If you do not use tobacco, do not start.  Lung cancer screening is recommended for adults aged 23 80 years who are at high risk for developing lung cancer because of a history of smoking. Yearly low-dose computed tomography (CT) is recommended for people who have at least a 30-pack-year history of smoking and are a current smoker or have quit within the past 15 years. A pack year of smoking is smoking an average of 1 pack of cigarettes a day for 1 year (for example: 1 pack a day for 30 years or 2 packs a day for 15 years). Yearly screening should continue until the smoker has stopped smoking for at least 15 years. Yearly screening should also be stopped for people who develop a health problem that would prevent them from having lung cancer treatment.  If you are pregnant, do not drink alcohol. If you are breastfeeding, be very cautious about drinking alcohol. If you are not pregnant and choose to drink alcohol, do not exceed 1 drink per day. One drink is considered to be 12 ounces (355 mL) of beer, 5 ounces (148 mL) of wine, or 1.5 ounces (44 mL) of liquor.  Avoid use of street drugs. Do not share needles with anyone. Ask for help if you need support or instructions about stopping the use of drugs.  High blood pressure causes heart disease and increases the risk of stroke. Blood pressure should be checked at least every 1 to 2 years. Ongoing high blood pressure should be treated  with medicines, if weight loss and exercise are not effective.  If you are 59 to 22 years old, ask your caregiver if you should take aspirin to prevent strokes.  Diabetes screening involves taking a blood sample to check your fasting blood sugar level. This should be done once every 3 years, after age 15, if you are within normal weight and without risk factors for diabetes. Testing should be considered at a younger age or be carried out more frequently if you are overweight and  have at least 1 risk factor for diabetes.  Breast cancer screening is essential preventative care for women. You should practice "breast self-awareness." This means understanding the normal appearance and feel of your breasts and may include breast self-examination. Any changes detected, no matter how small, should be reported to a caregiver. Women in their 41s and 30s should have a clinical breast exam (CBE) by a caregiver as part of a regular health exam every 1 to 3 years. After age 44, women should have a CBE every year. Starting at age 59, women should consider having a mammogram (breast X-ray) every year. Women who have a family history of breast cancer should talk to their caregiver about genetic screening. Women at a high risk of breast cancer should talk to their caregiver about having an MRI and a mammogram every year.  Breast cancer gene (BRCA)-related cancer risk assessment is recommended for women who have family members with BRCA-related cancers. BRCA-related cancers include breast, ovarian, tubal, and peritoneal cancers. Having family members with these cancers may be associated with an increased risk for harmful changes (mutations) in the breast cancer genes BRCA1 and BRCA2. Results of the assessment will determine the need for genetic counseling and BRCA1 and BRCA2 testing.  The Pap test is a screening test for cervical cancer. Women should have a Pap test starting at age 30. Between ages 95 and 10, Pap tests should be repeated every 2 years. Beginning at age 50, you should have a Pap test every 3 years as long as the past 3 Pap tests have been normal. If you had a hysterectomy for a problem that was not cancer or a condition that could lead to cancer, then you no longer need Pap tests. If you are between ages 53 and 2, and you have had normal Pap tests going back 10 years, you no longer need Pap tests. If you have had past treatment for cervical cancer or a condition that could lead to  cancer, you need Pap tests and screening for cancer for at least 20 years after your treatment. If Pap tests have been discontinued, risk factors (such as a new sexual partner) need to be reassessed to determine if screening should be resumed. Some women have medical problems that increase the chance of getting cervical cancer. In these cases, your caregiver may recommend more frequent screening and Pap tests.  The human papillomavirus (HPV) test is an additional test that may be used for cervical cancer screening. The HPV test looks for the virus that can cause the cell changes on the cervix. The cells collected during the Pap test can be tested for HPV. The HPV test could be used to screen women aged 32 years and older, and should be used in women of any age who have unclear Pap test results. After the age of 52, women should have HPV testing at the same frequency as a Pap test.  Colorectal cancer can be detected and often prevented. Most routine colorectal  cancer screening begins at the age of 15 and continues through age 41. However, your caregiver may recommend screening at an earlier age if you have risk factors for colon cancer. On a yearly basis, your caregiver may provide home test kits to check for hidden blood in the stool. Use of a small camera at the end of a tube, to directly examine the colon (sigmoidoscopy or colonoscopy), can detect the earliest forms of colorectal cancer. Talk to your caregiver about this at age 59, when routine screening begins. Direct examination of the colon should be repeated every 5 to 10 years through age 74, unless early forms of pre-cancerous polyps or small growths are found.  Hepatitis C blood testing is recommended for all people born from [birth year] through 1964 and any individual with known risks for hepatitis C.  Practice safe sex. Use condoms and avoid high-risk sexual practices to reduce the spread of sexually transmitted infections (STIs). Sexually active women  aged 77 and younger should be checked for Chlamydia, which is a common sexually transmitted infection. Older women with new or multiple partners should also be tested for Chlamydia. Testing for other STIs is recommended if you are sexually active and at increased risk.  Osteoporosis is a disease in which the bones lose minerals and strength with aging. This can result in serious bone fractures. The risk of osteoporosis can be identified using a bone density scan. Women ages 30 and over and women at risk for fractures or osteoporosis should discuss screening with their caregivers. Ask your caregiver whether you should be taking a calcium supplement or vitamin D to reduce the rate of osteoporosis.  Menopause can be associated with physical symptoms and risks. Hormone replacement therapy is available to decrease symptoms and risks. You should talk to your caregiver about whether hormone replacement therapy is right for you.  Use sunscreen. Apply sunscreen liberally and repeatedly throughout the day. You should seek shade when your shadow is shorter than you. Protect yourself by wearing long sleeves, pants, a wide-brimmed hat, and sunglasses year round, whenever you are outdoors.  Notify your caregiver of new moles or changes in moles, especially if there is a change in shape or color. Also notify your caregiver if a mole is larger than the size of a pencil eraser.  Stay current with your immunizations. Document Released: 11/13/2010 Document Revised: 08/25/2012 Document Reviewed: 11/13/2010 [facility] Patient Information 2014 [place].

## 2013-11-07 ENCOUNTER — Encounter (HOSPITAL_BASED_OUTPATIENT_CLINIC_OR_DEPARTMENT_OTHER): Payer: Self-pay | Admitting: Emergency Medicine

## 2013-11-07 ENCOUNTER — Emergency Department (HOSPITAL_BASED_OUTPATIENT_CLINIC_OR_DEPARTMENT_OTHER)
Admission: EM | Admit: 2013-11-07 | Discharge: 2013-11-07 | Discharge status: Against Medical Advice | Payer: BC Managed Care – PPO | Attending: Emergency Medicine | Admitting: Emergency Medicine

## 2013-11-07 DIAGNOSIS — Z87828 Personal history of other (healed) physical injury and trauma: Secondary | ICD-10-CM | POA: Insufficient documentation

## 2013-11-07 DIAGNOSIS — Z87440 Personal history of urinary (tract) infections: Secondary | ICD-10-CM | POA: Insufficient documentation

## 2013-11-07 DIAGNOSIS — R55 Syncope and collapse: Secondary | ICD-10-CM | POA: Insufficient documentation

## 2013-11-07 DIAGNOSIS — Z8659 Personal history of other mental and behavioral disorders: Secondary | ICD-10-CM | POA: Insufficient documentation

## 2013-11-07 DIAGNOSIS — M2548 Effusion, other site: Secondary | ICD-10-CM | POA: Insufficient documentation

## 2013-11-07 DIAGNOSIS — M2559 Pain in other specified joint: Secondary | ICD-10-CM | POA: Insufficient documentation

## 2013-11-07 DIAGNOSIS — Z880 Allergy status to penicillin: Secondary | ICD-10-CM | POA: Insufficient documentation

## 2013-11-07 DIAGNOSIS — M2550 Pain in unspecified joint: Secondary | ICD-10-CM

## 2013-11-07 DIAGNOSIS — H538 Other visual disturbances: Secondary | ICD-10-CM | POA: Insufficient documentation

## 2013-11-07 DIAGNOSIS — Z79899 Other long term (current) drug therapy: Secondary | ICD-10-CM | POA: Insufficient documentation

## 2013-11-07 DIAGNOSIS — Z8632 Personal history of gestational diabetes: Secondary | ICD-10-CM | POA: Insufficient documentation
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
## 2013-11-07 NOTE — ED Notes (Signed)
Patient donated plasma and afterwards became dizzy and felt as if she was going to faint. Per EMS patient's BP 80/60 seated and 104/68 lying down. C/o weakness

## 2013-11-19 ENCOUNTER — Encounter (HOSPITAL_COMMUNITY): Payer: Self-pay | Admitting: Emergency Medicine

## 2013-11-19 ENCOUNTER — Emergency Department (HOSPITAL_COMMUNITY)
Admission: EM | Admit: 2013-11-19 | Discharge: 2013-11-19 | Discharge status: Against Medical Advice | Payer: BC Managed Care – PPO | Attending: Emergency Medicine | Admitting: Emergency Medicine

## 2013-11-19 DIAGNOSIS — R1110 Vomiting, unspecified: Secondary | ICD-10-CM | POA: Insufficient documentation

## 2013-11-19 DIAGNOSIS — R42 Dizziness and giddiness: Secondary | ICD-10-CM | POA: Insufficient documentation

## 2013-11-19 LAB — CBC WITH DIFFERENTIAL/PLATELET
Basophils Absolute: 0 10*3/uL (ref 0.0–0.1)
Basophils Relative: 0 % (ref 0–1)
EOS ABS: 0.1 10*3/uL (ref 0.0–0.7)
EOS PCT: 0 % (ref 0–5)
HEMATOCRIT: 44.6 % (ref 36.0–46.0)
HEMOGLOBIN: 15.1 g/dL — AB (ref 12.0–15.0)
LYMPHS ABS: 6 10*3/uL — AB (ref 0.7–4.0)
Lymphocytes Relative: 32 % (ref 12–46)
MCH: 30.2 pg (ref 26.0–34.0)
MCHC: 33.9 g/dL (ref 30.0–36.0)
MCV: 89.2 fL (ref 78.0–100.0)
MONO ABS: 1.2 10*3/uL — AB (ref 0.1–1.0)
MONOS PCT: 6 % (ref 3–12)
Neutro Abs: 11.6 10*3/uL — ABNORMAL HIGH (ref 1.7–7.7)
Neutrophils Relative %: 62 % (ref 43–77)
Platelets: 312 10*3/uL (ref 150–400)
RBC: 5 MIL/uL (ref 3.87–5.11)
RDW: 13.4 % (ref 11.5–15.5)
WBC: 18.9 10*3/uL — ABNORMAL HIGH (ref 4.0–10.5)

## 2013-11-19 LAB — COMPREHENSIVE METABOLIC PANEL
ALT: 13 U/L (ref 0–35)
AST: 19 U/L (ref 0–37)
Albumin: 3.1 g/dL — ABNORMAL LOW (ref 3.5–5.2)
Alkaline Phosphatase: 59 U/L (ref 39–117)
Anion gap: 18 — ABNORMAL HIGH (ref 5–15)
BUN: 10 mg/dL (ref 6–23)
CALCIUM: 8.8 mg/dL (ref 8.4–10.5)
CO2: 19 mEq/L (ref 19–32)
CREATININE: 0.87 mg/dL (ref 0.50–1.10)
Chloride: 103 mEq/L (ref 96–112)
GFR calc non Af Amer: >90 mL/min (ref >90)
GLUCOSE: 123 mg/dL — AB (ref 70–99)
Potassium: 3.6 mEq/L — ABNORMAL LOW (ref 3.7–5.3)
Sodium: 140 mEq/L (ref 137–147)
TOTAL PROTEIN: 6.2 g/dL (ref 6.0–8.3)
Total Bilirubin: 0.4 mg/dL (ref 0.3–1.2)

## 2013-11-19 NOTE — ED Notes (Addendum)
Pt. reports nausea, vomitting and dizziness onset this evening after donating plasma. Alert and oriented / respirations unlabored / ambulatory.

## 2014-03-15 ENCOUNTER — Encounter (HOSPITAL_COMMUNITY): Payer: Self-pay | Admitting: Emergency Medicine

## 2014-04-20 ENCOUNTER — Emergency Department (HOSPITAL_COMMUNITY): Payer: No Typology Code available for payment source

## 2014-04-20 ENCOUNTER — Encounter (HOSPITAL_COMMUNITY): Payer: Self-pay | Admitting: Physical Medicine and Rehabilitation

## 2014-04-20 ENCOUNTER — Emergency Department (HOSPITAL_COMMUNITY)
Admission: EM | Admit: 2014-04-20 | Discharge: 2014-04-20 | Disposition: A | Discharge status: Home / Self Care | Payer: No Typology Code available for payment source | Attending: Emergency Medicine | Admitting: Emergency Medicine

## 2014-04-20 DIAGNOSIS — S8992XA Unspecified injury of left lower leg, initial encounter: Secondary | ICD-10-CM | POA: Insufficient documentation

## 2014-04-20 DIAGNOSIS — Z880 Allergy status to penicillin: Secondary | ICD-10-CM | POA: Insufficient documentation

## 2014-04-20 DIAGNOSIS — M62830 Muscle spasm of back: Secondary | ICD-10-CM | POA: Diagnosis not present

## 2014-04-20 DIAGNOSIS — Z8659 Personal history of other mental and behavioral disorders: Secondary | ICD-10-CM | POA: Insufficient documentation

## 2014-04-20 DIAGNOSIS — M25562 Pain in left knee: Secondary | ICD-10-CM

## 2014-04-20 DIAGNOSIS — Y998 Other external cause status: Secondary | ICD-10-CM | POA: Insufficient documentation

## 2014-04-20 DIAGNOSIS — Y92410 Unspecified street and highway as the place of occurrence of the external cause: Secondary | ICD-10-CM | POA: Insufficient documentation

## 2014-04-20 DIAGNOSIS — Z87440 Personal history of urinary (tract) infections: Secondary | ICD-10-CM | POA: Insufficient documentation

## 2014-04-20 DIAGNOSIS — Y9389 Activity, other specified: Secondary | ICD-10-CM | POA: Diagnosis not present

## 2014-04-20 MED ORDER — CYCLOBENZAPRINE HCL 10 MG PO TABS: 10.0000 mg | ORAL_TABLET | Freq: Three times a day (TID) | ORAL | Status: DC | PRN

## 2014-04-20 MED ORDER — NAPROXEN 500 MG PO TABS: 500.0000 mg | ORAL_TABLET | Freq: Two times a day (BID) | ORAL | Status: DC | PRN

## 2014-04-20 MED ORDER — HYDROCODONE-ACETAMINOPHEN 5-325 MG PO TABS: 1.0000 | ORAL_TABLET | Freq: Four times a day (QID) | ORAL | Status: DC | PRN

## 2014-04-20 MED ORDER — HYDROCODONE-ACETAMINOPHEN 5-325 MG PO TABS
1.0000 | ORAL_TABLET | Freq: Once | ORAL | Status: AC
  Administered 2014-04-20: 1 via ORAL
  Filled 2014-04-20: qty 1

## 2014-04-20 NOTE — ED Notes (Signed)
Pt refused teaching on crutches. States she had surgery prior and used them then.
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
## 2014-05-04 ENCOUNTER — Encounter (HOSPITAL_COMMUNITY): Payer: Self-pay

## 2014-05-04 ENCOUNTER — Inpatient Hospital Stay (HOSPITAL_COMMUNITY): Payer: BC Managed Care – PPO

## 2014-05-04 ENCOUNTER — Inpatient Hospital Stay (HOSPITAL_COMMUNITY)
Admission: AD | Admit: 2014-05-04 | Discharge: 2014-05-04 | Disposition: A | Discharge status: Home / Self Care | Payer: Self-pay | Source: Ambulatory Visit | Attending: Family Medicine | Admitting: Family Medicine

## 2014-05-04 DIAGNOSIS — R42 Dizziness and giddiness: Secondary | ICD-10-CM | POA: Insufficient documentation

## 2014-05-04 DIAGNOSIS — R109 Unspecified abdominal pain: Secondary | ICD-10-CM | POA: Insufficient documentation

## 2014-05-04 DIAGNOSIS — Z3A01 Less than 8 weeks gestation of pregnancy: Secondary | ICD-10-CM | POA: Insufficient documentation

## 2014-05-04 DIAGNOSIS — N760 Acute vaginitis: Secondary | ICD-10-CM | POA: Insufficient documentation

## 2014-05-04 DIAGNOSIS — B9689 Other specified bacterial agents as the cause of diseases classified elsewhere: Secondary | ICD-10-CM | POA: Insufficient documentation

## 2014-05-04 DIAGNOSIS — O26899 Other specified pregnancy related conditions, unspecified trimester: Secondary | ICD-10-CM

## 2014-05-04 DIAGNOSIS — O23591 Infection of other part of genital tract in pregnancy, first trimester: Secondary | ICD-10-CM | POA: Insufficient documentation

## 2014-05-04 LAB — URINALYSIS, ROUTINE W REFLEX MICROSCOPIC
Bilirubin Urine: NEGATIVE
Glucose, UA: NEGATIVE mg/dL
Hgb urine dipstick: NEGATIVE
Ketones, ur: NEGATIVE mg/dL
Leukocytes, UA: NEGATIVE
Nitrite: NEGATIVE
Protein, ur: NEGATIVE mg/dL
SPECIFIC GRAVITY, URINE: 1.02 (ref 1.005–1.030)
UROBILINOGEN UA: 0.2 mg/dL (ref 0.0–1.0)
pH: 7 (ref 5.0–8.0)

## 2014-05-04 LAB — WET PREP, GENITAL
Trich, Wet Prep: NONE SEEN
YEAST WET PREP: NONE SEEN

## 2014-05-04 LAB — COMPREHENSIVE METABOLIC PANEL
ALK PHOS: 60 U/L (ref 39–117)
ALT: 17 U/L (ref 0–35)
AST: 22 U/L (ref 0–37)
Albumin: 4 g/dL (ref 3.5–5.2)
Anion gap: 8 (ref 5–15)
BILIRUBIN TOTAL: 0.7 mg/dL (ref 0.3–1.2)
BUN: 6 mg/dL (ref 6–23)
CHLORIDE: 103 meq/L (ref 96–112)
CO2: 24 mmol/L (ref 19–32)
Calcium: 9.4 mg/dL (ref 8.4–10.5)
Creatinine, Ser: 0.55 mg/dL (ref 0.50–1.10)
GFR calc Af Amer: >90 mL/min (ref >90)
GFR calc non Af Amer: >90 mL/min (ref >90)
GLUCOSE: 82 mg/dL (ref 70–99)
POTASSIUM: 4 mmol/L (ref 3.5–5.1)
Sodium: 135 mmol/L (ref 135–145)
Total Protein: 7.1 g/dL (ref 6.0–8.3)

## 2014-05-04 LAB — CBC
HEMATOCRIT: 41.4 % (ref 36.0–46.0)
Hemoglobin: 14 g/dL (ref 12.0–15.0)
MCH: 30.8 pg (ref 26.0–34.0)
MCHC: 33.8 g/dL (ref 30.0–36.0)
MCV: 91 fL (ref 78.0–100.0)
Platelets: 253 10*3/uL (ref 150–400)
RBC: 4.55 MIL/uL (ref 3.87–5.11)
RDW: 13.3 % (ref 11.5–15.5)
WBC: 12.8 10*3/uL — AB (ref 4.0–10.5)

## 2014-05-04 LAB — HCG, QUANTITATIVE, PREGNANCY: HCG, BETA CHAIN, QUANT, S: 117545 m[IU]/mL — AB (ref <5)

## 2014-05-04 LAB — POCT PREGNANCY, URINE: PREG TEST UR: POSITIVE — AB

## 2014-05-04 MED ORDER — METRONIDAZOLE 500 MG PO TABS: 500.0000 mg | ORAL_TABLET | Freq: Two times a day (BID) | ORAL | Status: DC

## 2014-05-04 NOTE — MAU Note (Signed)
Patient states she had a positive home pregnancy test on 12-18. States she has had dizziness off and on for about one week. Has nausea with occasional vomiting. Denies bleeding or discharge. Has been having low abdominal pain for a couple of days that comes and goes.

## 2014-05-04 NOTE — Discharge Instructions (Signed)
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
## 2014-05-05 LAB — GC/CHLAMYDIA PROBE AMP
CT Probe RNA: NEGATIVE
GC Probe RNA: NEGATIVE

## 2014-05-05 LAB — HIV ANTIBODY (ROUTINE TESTING W REFLEX): HIV: NONREACTIVE

## 2014-05-28 ENCOUNTER — Encounter (HOSPITAL_COMMUNITY): Payer: Self-pay

## 2014-05-28 ENCOUNTER — Emergency Department (HOSPITAL_COMMUNITY)
Admission: EM | Admit: 2014-05-28 | Discharge: 2014-05-28 | Disposition: A | Discharge status: Home / Self Care | Payer: No Typology Code available for payment source | Attending: Emergency Medicine | Admitting: Emergency Medicine

## 2014-05-28 DIAGNOSIS — Z8679 Personal history of other diseases of the circulatory system: Secondary | ICD-10-CM | POA: Insufficient documentation

## 2014-05-28 DIAGNOSIS — Z87828 Personal history of other (healed) physical injury and trauma: Secondary | ICD-10-CM | POA: Insufficient documentation

## 2014-05-28 DIAGNOSIS — Z880 Allergy status to penicillin: Secondary | ICD-10-CM | POA: Insufficient documentation

## 2014-05-28 DIAGNOSIS — Y9389 Activity, other specified: Secondary | ICD-10-CM | POA: Insufficient documentation

## 2014-05-28 DIAGNOSIS — Z792 Long term (current) use of antibiotics: Secondary | ICD-10-CM | POA: Insufficient documentation

## 2014-05-28 DIAGNOSIS — Z8632 Personal history of gestational diabetes: Secondary | ICD-10-CM | POA: Insufficient documentation

## 2014-05-28 DIAGNOSIS — Z87440 Personal history of urinary (tract) infections: Secondary | ICD-10-CM | POA: Insufficient documentation

## 2014-05-28 DIAGNOSIS — Z8659 Personal history of other mental and behavioral disorders: Secondary | ICD-10-CM | POA: Insufficient documentation

## 2014-05-28 DIAGNOSIS — Y998 Other external cause status: Secondary | ICD-10-CM | POA: Insufficient documentation

## 2014-05-28 DIAGNOSIS — T7840XA Allergy, unspecified, initial encounter: Secondary | ICD-10-CM | POA: Insufficient documentation

## 2014-05-28 DIAGNOSIS — Y9289 Other specified places as the place of occurrence of the external cause: Secondary | ICD-10-CM | POA: Insufficient documentation

## 2014-05-28 DIAGNOSIS — X58XXXA Exposure to other specified factors, initial encounter: Secondary | ICD-10-CM | POA: Insufficient documentation

## 2014-05-28 DIAGNOSIS — Z8709 Personal history of other diseases of the respiratory system: Secondary | ICD-10-CM | POA: Insufficient documentation

## 2014-05-28 MED ORDER — FAMOTIDINE 20 MG PO TABS
20.0000 mg | ORAL_TABLET | Freq: Once | ORAL | Status: AC
  Administered 2014-05-28: 20 mg via ORAL
  Filled 2014-05-28: qty 1

## 2014-05-28 MED ORDER — OXYCODONE-ACETAMINOPHEN 5-325 MG PO TABS
1.0000 | ORAL_TABLET | Freq: Once | ORAL | Status: AC
  Administered 2014-05-28: 1 via ORAL
  Filled 2014-05-28: qty 1

## 2014-05-28 MED ORDER — PREDNISONE 20 MG PO TABS
60.0000 mg | ORAL_TABLET | Freq: Once | ORAL | Status: AC
  Administered 2014-05-28: 60 mg via ORAL
  Filled 2014-05-28: qty 3

## 2014-05-28 MED ORDER — DIPHENHYDRAMINE HCL 25 MG PO CAPS
50.0000 mg | ORAL_CAPSULE | Freq: Once | ORAL | Status: AC
  Administered 2014-05-28: 50 mg via ORAL
  Filled 2014-05-28: qty 2

## 2014-05-28 MED ORDER — PREDNISONE 20 MG PO TABS: ORAL_TABLET | ORAL | Status: AC

## 2014-05-28 NOTE — Discharge Instructions (Signed)
You may take Benadryl or Zyrtec/Claritin/Allegra as needed for swelling and itching.

## 2014-05-28 NOTE — ED Notes (Signed)
Edema to right eye. Eye completely closed.

## 2014-05-28 NOTE — ED Notes (Signed)
Pt had d/c done today.  Was given IV sedation meds and a cipro prior to d/c.  Pt got home and went to sleep.  Pt is having facial swelling and feeling "weird" in throat.
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
## 2014-09-08 ENCOUNTER — Encounter (HOSPITAL_COMMUNITY): Payer: Self-pay | Admitting: *Deleted

## 2014-09-08 ENCOUNTER — Emergency Department (HOSPITAL_COMMUNITY)
Admission: EM | Admit: 2014-09-08 | Discharge: 2014-09-08 | Disposition: A | Discharge status: Home / Self Care | Payer: Medicaid Other | Attending: Emergency Medicine | Admitting: Emergency Medicine

## 2014-09-08 DIAGNOSIS — Z87828 Personal history of other (healed) physical injury and trauma: Secondary | ICD-10-CM | POA: Insufficient documentation

## 2014-09-08 DIAGNOSIS — Z8709 Personal history of other diseases of the respiratory system: Secondary | ICD-10-CM | POA: Diagnosis not present

## 2014-09-08 DIAGNOSIS — Z8679 Personal history of other diseases of the circulatory system: Secondary | ICD-10-CM | POA: Insufficient documentation

## 2014-09-08 DIAGNOSIS — Z87440 Personal history of urinary (tract) infections: Secondary | ICD-10-CM | POA: Insufficient documentation

## 2014-09-08 DIAGNOSIS — Z8659 Personal history of other mental and behavioral disorders: Secondary | ICD-10-CM | POA: Diagnosis not present

## 2014-09-08 DIAGNOSIS — Z3201 Encounter for pregnancy test, result positive: Secondary | ICD-10-CM | POA: Insufficient documentation

## 2014-09-08 DIAGNOSIS — O219 Vomiting of pregnancy, unspecified: Secondary | ICD-10-CM

## 2014-09-08 DIAGNOSIS — Z880 Allergy status to penicillin: Secondary | ICD-10-CM | POA: Insufficient documentation

## 2014-09-08 DIAGNOSIS — R509 Fever, unspecified: Secondary | ICD-10-CM | POA: Diagnosis not present

## 2014-09-08 DIAGNOSIS — Z3490 Encounter for supervision of normal pregnancy, unspecified, unspecified trimester: Secondary | ICD-10-CM

## 2014-09-08 DIAGNOSIS — R531 Weakness: Secondary | ICD-10-CM | POA: Diagnosis not present

## 2014-09-08 DIAGNOSIS — R112 Nausea with vomiting, unspecified: Secondary | ICD-10-CM | POA: Diagnosis not present

## 2014-09-08 DIAGNOSIS — M791 Myalgia: Secondary | ICD-10-CM | POA: Diagnosis not present

## 2014-09-08 DIAGNOSIS — R740 Nonspecific elevation of levels of transaminase and lactic acid dehydrogenase [LDH]: Secondary | ICD-10-CM | POA: Insufficient documentation

## 2014-09-08 DIAGNOSIS — Z792 Long term (current) use of antibiotics: Secondary | ICD-10-CM | POA: Diagnosis not present

## 2014-09-08 LAB — COMPREHENSIVE METABOLIC PANEL
ALBUMIN: 4.1 g/dL (ref 3.5–5.2)
ALT: 41 U/L — AB (ref 0–35)
AST: 59 U/L — AB (ref 0–37)
Alkaline Phosphatase: 83 U/L (ref 39–117)
Anion gap: 12 (ref 5–15)
BUN: 8 mg/dL (ref 6–23)
CALCIUM: 9.5 mg/dL (ref 8.4–10.5)
CO2: 20 mmol/L (ref 19–32)
Chloride: 106 mmol/L (ref 96–112)
Creatinine, Ser: 0.83 mg/dL (ref 0.50–1.10)
GFR calc Af Amer: >90 mL/min (ref >90)
GFR calc non Af Amer: >90 mL/min (ref >90)
Glucose, Bld: 91 mg/dL (ref 70–99)
Potassium: 3.7 mmol/L (ref 3.5–5.1)
Sodium: 138 mmol/L (ref 135–145)
TOTAL PROTEIN: 7.7 g/dL (ref 6.0–8.3)
Total Bilirubin: 0.9 mg/dL (ref 0.3–1.2)

## 2014-09-08 LAB — CBC WITH DIFFERENTIAL/PLATELET
BASOS ABS: 0 10*3/uL (ref 0.0–0.1)
BASOS PCT: 0 % (ref 0–1)
EOS PCT: 0 % (ref 0–5)
Eosinophils Absolute: 0 10*3/uL (ref 0.0–0.7)
HEMATOCRIT: 40.3 % (ref 36.0–46.0)
Hemoglobin: 13.9 g/dL (ref 12.0–15.0)
Lymphocytes Relative: 21 % (ref 12–46)
Lymphs Abs: 2.9 10*3/uL (ref 0.7–4.0)
MCH: 31 pg (ref 26.0–34.0)
MCHC: 34.5 g/dL (ref 30.0–36.0)
MCV: 89.8 fL (ref 78.0–100.0)
MONO ABS: 0.7 10*3/uL (ref 0.1–1.0)
Monocytes Relative: 5 % (ref 3–12)
Neutro Abs: 10.5 10*3/uL — ABNORMAL HIGH (ref 1.7–7.7)
Neutrophils Relative %: 74 % (ref 43–77)
Platelets: 306 10*3/uL (ref 150–400)
RBC: 4.49 MIL/uL (ref 3.87–5.11)
RDW: 13 % (ref 11.5–15.5)
WBC: 14.2 10*3/uL — ABNORMAL HIGH (ref 4.0–10.5)

## 2014-09-08 LAB — LIPASE, BLOOD: Lipase: 15 U/L (ref 11–59)

## 2014-09-08 LAB — URINALYSIS, ROUTINE W REFLEX MICROSCOPIC
Bilirubin Urine: NEGATIVE
GLUCOSE, UA: NEGATIVE mg/dL
Hgb urine dipstick: NEGATIVE
Ketones, ur: 15 mg/dL — AB
LEUKOCYTES UA: NEGATIVE
Nitrite: NEGATIVE
Protein, ur: 30 mg/dL — AB
Specific Gravity, Urine: 1.027 (ref 1.005–1.030)
UROBILINOGEN UA: 0.2 mg/dL (ref 0.0–1.0)
pH: 6 (ref 5.0–8.0)

## 2014-09-08 LAB — URINE MICROSCOPIC-ADD ON

## 2014-09-08 LAB — PREGNANCY, URINE: Preg Test, Ur: POSITIVE — AB

## 2014-09-08 MED ORDER — DOXYLAMINE SUCCINATE (SLEEP) 25 MG PO TABS: 25.0000 mg | ORAL_TABLET | Freq: Four times a day (QID) | ORAL | Status: DC | PRN

## 2014-09-08 MED ORDER — ONDANSETRON 4 MG PO TBDP
ORAL_TABLET | ORAL | Status: AC
  Filled 2014-09-08: qty 2

## 2014-09-08 MED ORDER — ONDANSETRON 4 MG PO TBDP
8.0000 mg | ORAL_TABLET | Freq: Once | ORAL | Status: AC
  Administered 2014-09-08: 8 mg via ORAL

## 2014-09-08 MED ORDER — VITAMIN B-6 25 MG PO TABS: 25.0000 mg | ORAL_TABLET | Freq: Four times a day (QID) | ORAL | Status: DC | PRN

## 2014-09-08 NOTE — Discharge Instructions (Signed)
Please read and follow all provided instructions.  Your diagnoses today include:  1. Pregnancy   2. Nausea and vomiting during pregnancy     Tests performed today include:  Blood counts and electrolytes  Blood tests to check liver and kidney function  Urine test to look for infection and pregnancy (in women)  Vital signs. See below for your results today.   Medications prescribed:   doxylamine - antihistamine   Vitamin B6 - used for nausea in pregnancy  Take any prescribed medications only as directed.  Home care instructions:   Follow any educational materials contained in this packet.  Follow-up instructions: Please follow-up with your primary care provider in the next 3 days for further evaluation of your symptoms.    Return instructions:  SEEK IMMEDIATE MEDICAL ATTENTION IF:   A temperature above 101F develops   Repeated vomiting occurs (multiple episodes)   You have pain that becomes localized to portions of the abdomen. The right side could possibly be appendicitis. In an adult, the left lower portion of the abdomen could be colitis or diverticulitis.   Blood is being passed in stools or vomit (bright red or black tarry stools)   You develop chest pain, difficulty breathing, dizziness or fainting, or become confused, poorly responsive, or inconsolable (young children)  If you have any other emergent concerns regarding your health   Your vital signs today were: BP 110/64 mmHg   Pulse 62   Temp(Src) 98.2 F (36.8 C) (Oral)   Resp 16   Ht 5\' 1"  (1.549 m)   Wt 193 lb 6.4 oz (87.726 kg)   BMI 36.56 kg/m2   SpO2 99%   LMP 08/11/2014   Breastfeeding? Unknown If your blood pressure (bp) was elevated above 135/85 this visit, please have this repeated by your doctor within one month. --------------
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
## 2014-09-08 NOTE — ED Notes (Signed)
Pt to ED c/o vomiting x10 times, weakness and body aches since this morning.

## 2014-09-14 ENCOUNTER — Emergency Department (INDEPENDENT_AMBULATORY_CARE_PROVIDER_SITE_OTHER)
Admission: EM | Admit: 2014-09-14 | Discharge: 2014-09-14 | Disposition: A | Discharge status: Other Acute Inpatient Hospital | Payer: Self-pay | Source: Home / Self Care | Attending: Family Medicine | Admitting: Family Medicine

## 2014-09-14 ENCOUNTER — Encounter (HOSPITAL_COMMUNITY): Payer: Self-pay | Admitting: *Deleted

## 2014-09-14 ENCOUNTER — Inpatient Hospital Stay (HOSPITAL_COMMUNITY): Payer: Medicaid Other

## 2014-09-14 ENCOUNTER — Inpatient Hospital Stay (HOSPITAL_COMMUNITY)
Admission: AD | Admit: 2014-09-14 | Discharge: 2014-09-14 | Disposition: A | Discharge status: Home / Self Care | Payer: Medicaid Other | Source: Ambulatory Visit | Attending: Family Medicine | Admitting: Family Medicine

## 2014-09-14 DIAGNOSIS — Z3A01 Less than 8 weeks gestation of pregnancy: Secondary | ICD-10-CM | POA: Diagnosis not present

## 2014-09-14 DIAGNOSIS — Z880 Allergy status to penicillin: Secondary | ICD-10-CM | POA: Insufficient documentation

## 2014-09-14 DIAGNOSIS — R102 Pelvic and perineal pain: Secondary | ICD-10-CM

## 2014-09-14 DIAGNOSIS — O26899 Other specified pregnancy related conditions, unspecified trimester: Secondary | ICD-10-CM

## 2014-09-14 DIAGNOSIS — R109 Unspecified abdominal pain: Secondary | ICD-10-CM

## 2014-09-14 DIAGNOSIS — N949 Unspecified condition associated with female genital organs and menstrual cycle: Secondary | ICD-10-CM

## 2014-09-14 DIAGNOSIS — O9989 Other specified diseases and conditions complicating pregnancy, childbirth and the puerperium: Secondary | ICD-10-CM | POA: Insufficient documentation

## 2014-09-14 DIAGNOSIS — O26891 Other specified pregnancy related conditions, first trimester: Secondary | ICD-10-CM

## 2014-09-14 LAB — CBC
HCT: 39.1 % (ref 36.0–46.0)
HEMOGLOBIN: 13.7 g/dL (ref 12.0–15.0)
MCH: 31.4 pg (ref 26.0–34.0)
MCHC: 35 g/dL (ref 30.0–36.0)
MCV: 89.7 fL (ref 78.0–100.0)
Platelets: 278 10*3/uL (ref 150–400)
RBC: 4.36 MIL/uL (ref 3.87–5.11)
RDW: 12.8 % (ref 11.5–15.5)
WBC: 11.8 10*3/uL — AB (ref 4.0–10.5)

## 2014-09-14 LAB — URINALYSIS, ROUTINE W REFLEX MICROSCOPIC
BILIRUBIN URINE: NEGATIVE
Glucose, UA: NEGATIVE mg/dL
Hgb urine dipstick: NEGATIVE
Ketones, ur: 15 mg/dL — AB
Nitrite: NEGATIVE
Protein, ur: NEGATIVE mg/dL
SPECIFIC GRAVITY, URINE: 1.02 (ref 1.005–1.030)
UROBILINOGEN UA: 0.2 mg/dL (ref 0.0–1.0)
pH: 6 (ref 5.0–8.0)

## 2014-09-14 LAB — WET PREP, GENITAL
Trich, Wet Prep: NONE SEEN
Yeast Wet Prep HPF POC: NONE SEEN

## 2014-09-14 LAB — URINE MICROSCOPIC-ADD ON

## 2014-09-14 LAB — HCG, QUANTITATIVE, PREGNANCY: hCG, Beta Chain, Quant, S: 3069 m[IU]/mL — ABNORMAL HIGH (ref <5)

## 2014-09-14 LAB — POCT PREGNANCY, URINE: Preg Test, Ur: POSITIVE — AB

## 2014-09-14 MED ORDER — CONCEPT OB 130-92.4-1 MG PO CAPS: 1.0000 | ORAL_CAPSULE | Freq: Every day | ORAL | Status: DC
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
## 2014-09-14 NOTE — ED Notes (Signed)
Pt  Is  [redacted]  Weeks  Pregnant     She  Reports     Lower abdominal pain        denys  Any  Bleeding or  Discharge     PT    Was  Seen  Er  One  Week  Ago  For  Pregnancy  Emesis     Pt ambulated  To  Room  With a  Steady  Fluid  Gait

## 2014-09-14 NOTE — ED Notes (Signed)
Pt  Was  Advised npo

## 2014-09-14 NOTE — Discharge Instructions (Signed)
Your ultrasound today shows a probable gestational sac, but we cannot yet be sure that the pregnancy is truly growing in the uterus. You will need to return in two days for bloodwork.   Abdominal Pain During Pregnancy Abdominal pain is common in pregnancy. Most of the time, it does not cause harm. There are many causes of abdominal pain. Some causes are more serious than others. Some of the causes of abdominal pain in pregnancy are easily diagnosed. Occasionally, the diagnosis takes time to understand. Other times, the cause is not determined. Abdominal pain can be a sign that something is very wrong with the pregnancy, or the pain may have nothing to do with the pregnancy at all. For this reason, always tell your health care provider if you have any abdominal discomfort. HOME CARE INSTRUCTIONS  Monitor your abdominal pain for any changes. The following actions may help to alleviate any discomfort you are experiencing:  Do not have sexual intercourse or put anything in your vagina until your symptoms go away completely.  Get plenty of rest until your pain improves.  Drink clear fluids if you feel nauseous. Avoid solid food as long as you are uncomfortable or nauseous.  Only take over-the-counter or prescription medicine as directed by your health care provider.  Keep all follow-up appointments with your health care provider. SEEK IMMEDIATE MEDICAL CARE IF:  You are bleeding, leaking fluid, or passing tissue from the vagina.  You have increasing pain or cramping.  You have persistent vomiting.  You have painful or bloody urination.  You have a fever.  You notice a decrease in your baby's movements.  You have extreme weakness or feel faint.  You have shortness of breath, with or without abdominal pain.  You develop a severe headache with abdominal pain.  You have abnormal vaginal discharge with abdominal pain.  You have persistent diarrhea.  You have abdominal pain that  continues even after rest, or gets worse. MAKE SURE YOU:   Understand these instructions.  Will watch your condition.  Will get help right away if you are not doing well or get worse. Document Released: 04/30/2005 Document Revised: 02/18/2013 Document Reviewed: 11/27/2012 [facility] Patient Information 2015 [place], [place]. This information is not intended to replace advice given to you by your health care provider. Make sure you discuss any questions you have with your health care provider.  Ectopic Pregnancy An ectopic pregnancy is when the fertilized egg attaches (implants) outside the uterus. Most ectopic pregnancies occur in the fallopian tube. Rarely do ectopic pregnancies occur on the ovary, intestine, pelvis, or cervix. In an ectopic pregnancy, the fertilized egg does not have the ability to develop into a normal, healthy baby.  A ruptured ectopic pregnancy is one in which the fallopian tube gets torn or bursts and results in internal bleeding. Often there is intense abdominal pain, and sometimes, vaginal bleeding. Having an ectopic pregnancy can be life threatening. If left untreated, this dangerous condition can lead to a blood transfusion, abdominal surgery, or even death. CAUSES  Damage to the fallopian tubes is the suspected cause in most ectopic pregnancies.  RISK FACTORS Depending on your circumstances, the risk of having an ectopic pregnancy will vary. The level of risk can be divided into three categories. High Risk  You have gone through infertility treatment.  You have had a previous ectopic pregnancy.  You have had previous tubal surgery.  You have had previous surgery to have the fallopian tubes tied (tubal ligation).  You have  tubal problems or diseases.  You have been exposed to DES. DES is a medicine that was used until 1971 and had effects on babies whose mothers took the medicine.  You become pregnant while using an intrauterine device (IUD) for birth  control. Moderate Risk  You have a history of infertility.  You have a history of a sexually transmitted infection (STI).  You have a history of pelvic inflammatory disease (PID).  You have scarring from endometriosis.  You have multiple sexual partners.  You smoke. Low Risk  You have had previous pelvic surgery.  You use vaginal douching.  You became sexually active before 23 years of age. SIGNS AND SYMPTOMS  An ectopic pregnancy should be suspected in anyone who has missed a period and has abdominal pain or bleeding.  You may experience normal pregnancy symptoms, such as:  Nausea.  Tiredness.  Breast tenderness.  Other symptoms may include:  Pain with intercourse.  Irregular vaginal bleeding or spotting.  Cramping or pain on one side or in the lower abdomen.  Fast heartbeat.  Passing out while having a bowel movement.  Symptoms of a ruptured ectopic pregnancy and internal bleeding may include:  Sudden, severe pain in the abdomen and pelvis.  Dizziness or fainting.  Pain in the shoulder area. DIAGNOSIS  Tests that may be performed include:  A pregnancy test.  An ultrasound test.  Testing the specific level of pregnancy hormone in the bloodstream.  Taking a sample of uterus tissue (dilation and curettage, D&C).  Surgery to perform a visual exam of the inside of the abdomen using a thin, lighted tube with a tiny camera on the end (laparoscope). TREATMENT  An injection of a medicine called methotrexate may be given. This medicine causes the pregnancy tissue to be absorbed. It is given if:  The diagnosis is made early.  The fallopian tube has not ruptured.  You are considered to be a good candidate for the medicine. Usually, pregnancy hormone blood levels are checked after methotrexate treatment. This is to be sure the medicine is effective. It may take 4-6 weeks for the pregnancy to be absorbed (though most pregnancies will be absorbed by 3  weeks). Surgical treatment may be needed. A laparoscope may be used to remove the pregnancy tissue. If severe internal bleeding occurs, a cut (incision) may be made in the lower abdomen (laparotomy), and the ectopic pregnancy is removed. This stops the bleeding. Part of the fallopian tube, or the whole tube, may be removed as well (salpingectomy). After surgery, pregnancy hormone tests may be done to be sure there is no pregnancy tissue left. You may receive a Rho (D) immune globulin shot if you are Rh negative and the father is Rh positive, or if you do not know the Rh type of the father. This is to prevent problems with any future pregnancy. SEEK IMMEDIATE MEDICAL CARE IF:  You have any symptoms of an ectopic pregnancy. This is a medical emergency. MAKE SURE YOU:  Understand these instructions.  Will watch your condition.  Will get help right away if you are not doing well or get worse. Document Released: 06/07/2004 Document Revised: 09/14/2013 Document Reviewed: 11/27/2012 [facility] Patient Information 2015 [place]. This information is not intended to replace advice given to you by your health care provider. Make sure you discuss any questions you have with your health care provider.

## 2014-09-14 NOTE — MAU Note (Signed)
Pain in bil lower abd that is sharp and comes and goes. [facility] and found out was pregnant. Denies bleeding or d/c
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
## 2014-09-15 LAB — HIV ANTIBODY (ROUTINE TESTING W REFLEX): HIV SCREEN 4TH GENERATION: NONREACTIVE

## 2014-09-15 LAB — GC/CHLAMYDIA PROBE AMP (~~LOC~~) NOT AT ARMC
Chlamydia: NEGATIVE
Neisseria Gonorrhea: NEGATIVE

## 2014-09-16 ENCOUNTER — Inpatient Hospital Stay (HOSPITAL_COMMUNITY)
Admission: AD | Admit: 2014-09-16 | Discharge: 2014-09-16 | Disposition: A | Discharge status: Home / Self Care | Payer: Medicaid Other | Source: Ambulatory Visit | Attending: Obstetrics & Gynecology | Admitting: Obstetrics & Gynecology

## 2014-09-16 DIAGNOSIS — R109 Unspecified abdominal pain: Secondary | ICD-10-CM | POA: Diagnosis present

## 2014-09-16 DIAGNOSIS — O9989 Other specified diseases and conditions complicating pregnancy, childbirth and the puerperium: Secondary | ICD-10-CM | POA: Diagnosis not present

## 2014-09-16 DIAGNOSIS — Z3A01 Less than 8 weeks gestation of pregnancy: Secondary | ICD-10-CM | POA: Insufficient documentation

## 2014-09-16 DIAGNOSIS — O26899 Other specified pregnancy related conditions, unspecified trimester: Secondary | ICD-10-CM

## 2014-09-16 LAB — HCG, QUANTITATIVE, PREGNANCY: hCG, Beta Chain, Quant, S: 7359 m[IU]/mL — ABNORMAL HIGH (ref <5)
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
## 2014-09-16 NOTE — Discharge Instructions (Signed)
°Ectopic Pregnancy °An ectopic pregnancy is when the fertilized egg attaches (implants) outside the uterus. Most ectopic pregnancies occur in the fallopian tube. Rarely do ectopic pregnancies occur on the ovary, intestine, pelvis, or cervix. In an ectopic pregnancy, the fertilized egg does not have the ability to develop into a normal, healthy baby.  °A ruptured ectopic pregnancy is one in which the fallopian tube gets torn or bursts and results in internal bleeding. Often there is intense abdominal pain, and sometimes, vaginal bleeding. Having an ectopic pregnancy can be life threatening. If left untreated, this dangerous condition can lead to a blood transfusion, abdominal surgery, or even death. °CAUSES  °Damage to the fallopian tubes is the suspected cause in most ectopic pregnancies.  °RISK FACTORS °Depending on your circumstances, the risk of having an ectopic pregnancy will vary. The level of risk can be divided into three categories. °High Risk °· You have gone through infertility treatment. °· You have had a previous ectopic pregnancy. °· You have had previous tubal surgery. °· You have had previous surgery to have the fallopian tubes tied (tubal ligation). °· You have tubal problems or diseases. °· You have been exposed to DES. DES is a medicine that was used until 1971 and had effects on babies whose mothers took the medicine. °· You become pregnant while using an intrauterine device (IUD) for birth control.  °Moderate Risk °· You have a history of infertility. °· You have a history of a sexually transmitted infection (STI). °· You have a history of pelvic inflammatory disease (PID). °· You have scarring from endometriosis. °· You have multiple sexual partners. °· You smoke.  °Low Risk °· You have had previous pelvic surgery. °· You use vaginal douching. °· You became sexually active before 23 years of age. °SIGNS AND SYMPTOMS  °An ectopic pregnancy should be suspected in anyone who has missed a period  and has abdominal pain or bleeding. °· You may experience normal pregnancy symptoms, such as: °¨ Nausea. °¨ Tiredness. °¨ Breast tenderness. °· Other symptoms may include: °¨ Pain with intercourse. °¨ Irregular vaginal bleeding or spotting. °¨ Cramping or pain on one side or in the lower abdomen. °¨ Fast heartbeat. °¨ Passing out while having a bowel movement. °· Symptoms of a ruptured ectopic pregnancy and internal bleeding may include: °¨ Sudden, severe pain in the abdomen and pelvis. °¨ Dizziness or fainting. °¨ Pain in the shoulder area. °DIAGNOSIS  °Tests that may be performed include: °· A pregnancy test. °· An ultrasound test. °· Testing the specific level of pregnancy hormone in the bloodstream. °· Taking a sample of uterus tissue (dilation and curettage, D&C). °· Surgery to perform a visual exam of the inside of the abdomen using a thin, lighted tube with a tiny camera on the end (laparoscope). °TREATMENT  °An injection of a medicine called methotrexate may be given. This medicine causes the pregnancy tissue to be absorbed. It is given if: °· The diagnosis is made early. °· The fallopian tube has not ruptured. °· You are considered to be a good candidate for the medicine. °Usually, pregnancy hormone blood levels are checked after methotrexate treatment. This is to be sure the medicine is effective. It may take 4-6 weeks for the pregnancy to be absorbed (though most pregnancies will be absorbed by 3 weeks). °Surgical treatment may be needed. A laparoscope may be used to remove the pregnancy tissue. If severe internal bleeding occurs, a cut (incision) may be made in the lower abdomen (laparotomy), and the ectopic   pregnancy is removed. This stops the bleeding. Part of the fallopian tube, or the whole tube, may be removed as well (salpingectomy). After surgery, pregnancy hormone tests may be done to be sure there is no pregnancy tissue left. You may receive a Rho (D) immune globulin shot if you are Rh negative  and the father is Rh positive, or if you do not know the Rh type of the father. This is to prevent problems with any future pregnancy. °SEEK IMMEDIATE MEDICAL CARE IF:  °You have any symptoms of an ectopic pregnancy. This is a medical emergency. °MAKE SURE YOU: °· Understand these instructions. °· Will watch your condition. °· Will get help right away if you are not doing well or get worse. °Document Released: 06/07/2004 Document Revised: 09/14/2013 Document Reviewed: 11/27/2012 °ExitCare® Patient Information ©2015 [employer]. This information is not intended to replace advice given to you by your health care provider. Make sure you discuss any questions you have with your health care provider. ° ° °

## 2014-09-23 ENCOUNTER — Other Ambulatory Visit (HOSPITAL_COMMUNITY): Payer: Self-pay | Admitting: Obstetrics and Gynecology

## 2014-09-23 DIAGNOSIS — O26899 Other specified pregnancy related conditions, unspecified trimester: Secondary | ICD-10-CM

## 2014-09-23 DIAGNOSIS — R109 Unspecified abdominal pain: Principal | ICD-10-CM

## 2014-09-24 ENCOUNTER — Inpatient Hospital Stay (HOSPITAL_COMMUNITY)
Admission: AD | Admit: 2014-09-24 | Discharge: 2014-09-24 | Disposition: A | Discharge status: Home / Self Care | Payer: Medicaid Other | Source: Ambulatory Visit | Attending: Obstetrics & Gynecology | Admitting: Obstetrics & Gynecology

## 2014-09-24 ENCOUNTER — Ambulatory Visit (HOSPITAL_COMMUNITY)
Admission: RE | Admit: 2014-09-24 | Discharge: 2014-09-24 | Disposition: A | Discharge status: Home / Self Care | Payer: Medicaid Other | Source: Ambulatory Visit | Attending: Obstetrics and Gynecology | Admitting: Obstetrics and Gynecology

## 2014-09-24 DIAGNOSIS — R109 Unspecified abdominal pain: Secondary | ICD-10-CM | POA: Insufficient documentation

## 2014-09-24 DIAGNOSIS — Z3A01 Less than 8 weeks gestation of pregnancy: Secondary | ICD-10-CM

## 2014-09-24 DIAGNOSIS — O208 Other hemorrhage in early pregnancy: Secondary | ICD-10-CM

## 2014-09-24 DIAGNOSIS — O26899 Other specified pregnancy related conditions, unspecified trimester: Secondary | ICD-10-CM

## 2014-09-24 DIAGNOSIS — Z3490 Encounter for supervision of normal pregnancy, unspecified, unspecified trimester: Secondary | ICD-10-CM

## 2014-09-24 NOTE — Discharge Instructions (Signed)
First Trimester of Pregnancy The first trimester of pregnancy is from week 1 until the end of week 12 (months 1 through 3). During this time, your baby will begin to develop inside you. At 6-8 weeks, the eyes and face are formed, and the heartbeat can be seen on ultrasound. At the end of 12 weeks, all the baby's organs are formed. Prenatal care is all the medical care you receive before the birth of your baby. Make sure you get good prenatal care and follow all of your doctor's instructions. HOME CARE  Medicines  Take medicine only as told by your doctor. Some medicines are safe and some are not during pregnancy.  Take your prenatal vitamins as told by your doctor.  Take medicine that helps you poop (stool softener) as needed if your doctor says it is okay. Diet  Eat regular, healthy meals.  Your doctor will tell you the amount of weight gain that is right for you.  Avoid raw meat and uncooked cheese.  If you feel sick to your stomach (nauseous) or throw up (vomit):  Eat 4 or 5 small meals a day instead of 3 large meals.  Try eating a few soda crackers.  Drink liquids between meals instead of during meals.  If you have a hard time pooping (constipation):  Eat high-fiber foods like fresh vegetables, fruit, and whole grains.  Drink enough fluids to keep your pee (urine) clear or pale yellow. Activity and Exercise  Exercise only as told by your doctor. Stop exercising if you have cramps or pain in your lower belly (abdomen) or low back.  Try to avoid standing for long periods of time. Move your legs often if you must stand in one place for a long time.  Avoid heavy lifting.  Wear low-heeled shoes. Sit and stand up straight.  You can have sex unless your doctor tells you not to. Relief of Pain or Discomfort  Wear a good support bra if your breasts are sore.  Take warm water baths (sitz baths) to soothe pain or discomfort caused by hemorrhoids. Use hemorrhoid cream if your  doctor says it is okay.  Rest with your legs raised if you have leg cramps or low back pain.  Wear support hose if you have puffy, bulging veins (varicose veins) in your legs. Raise (elevate) your feet for 15 minutes, 3-4 times a day. Limit salt in your diet. Prenatal Care  Schedule your prenatal visits by the twelfth week of pregnancy.  Write down your questions. Take them to your prenatal visits.  Keep all your prenatal visits as told by your doctor. Safety  Wear your seat belt at all times when driving.  Make a list of emergency phone numbers. The list should include numbers for family, friends, the hospital, and police and fire departments. General Tips  Ask your doctor for a referral to a local prenatal class. Begin classes no later than at the start of month 6 of your pregnancy.  Ask for help if you need counseling or help with nutrition. Your doctor can give you advice or tell you where to go for help.  Do not use hot tubs, steam rooms, or saunas.  Do not douche or use tampons or scented sanitary pads.  Do not cross your legs for long periods of time.  Avoid litter boxes and soil used by cats.  Avoid all smoking, herbs, and alcohol. Avoid drugs not approved by your doctor.  Visit your dentist. At home, brush your teeth   with a soft toothbrush. Be gentle when you floss. GET HELP IF:  You are dizzy.  You have mild cramps or pressure in your lower belly.  You have a nagging pain in your belly area.  You continue to feel sick to your stomach, throw up, or have watery poop (diarrhea).  You have a bad smelling fluid coming from your vagina.  You have pain with peeing (urination).  You have increased puffiness (swelling) in your face, hands, legs, or ankles. GET HELP RIGHT AWAY IF:   You have a fever.  You are leaking fluid from your vagina.  You have spotting or bleeding from your vagina.  You have very bad belly cramping or pain.  You gain or lose weight  rapidly.  You throw up blood. It may look like coffee grounds.  You are around people who have German measles, fifth disease, or chickenpox.  You have a very bad headache.  You have shortness of breath.  You have any kind of trauma, such as from a fall or a car accident. Document Released: 10/17/2007 Document Revised: 09/14/2013 Document Reviewed: 03/10/2013 ExitCare Patient Information 2015 [employer]. This information is not intended to replace advice given to you by your health care provider. Make sure you discuss any questions you have with your health care provider.  

## 2014-09-24 NOTE — MAU Provider Note (Signed)
Ms. Sandra Schultz is a 24 y.o. G3P1011 at 705w2d who presents to MAU today for follow-up US results. She denies abdominal pain, vaginal bleeding, N/V or fever today.   LMP 08/11/2014  GENERAL: Well-developed, well-nourished female in no acute distress.  HEENT: Normocephalic, atraumatic.   LUNGS: Effort normal HEART: Regular rate  SKIN: Warm, dry and without erythema PSYCH: Normal mood and affect  [place] Ob Transvaginal  09/24/2014   CLINICAL DATA:  Abdominal pain in pregnancy. First trimester pregnancy with uncertain fetal viability. Gestational age by LMP of 6 weeks 2 days.  EXAM: TRANSVAGINAL OB ULTRASOUND  TECHNIQUE: Transvaginal ultrasound was performed for complete evaluation of the gestation as well as the maternal uterus, adnexal regions, and pelvic cul-de-sac.  COMPARISON:  09/14/2014  FINDINGS: Intrauterine gestational sac: Visualized/normal in shape.  Yolk sac:  Visualized  Embryo:  Visualized  Cardiac Activity: Visualized  Heart Rate: 115 bpm  CRL:   3  mm   6 w 0 d                  US EDC: 05/20/2015  Maternal uterus/adnexae: Small subchorionic hemorrhage or implantation bleed noted. Normal appearance of right ovary. Left ovary not visualized, however no mass or free fluid identified.  IMPRESSION: Single living IUP measuring 6 weeks 0 days with US EDC of 05/20/2015. This is concordant with LMP.  Small subchorionic hemorrhage or implantation bleed noted.   Electronically Signed   By: Myles RosenthalJohn  Stahl M.D.   On: 09/24/2014 13:26    A: SIUP at 6168w0d Small subchorionic hemorrhage   P: Discharge home First trimester warning signs discussed Pregnancy confirmation letter given with list of area OB providers Patient advised to start prenatal care with provider of choice Patient may return to MAU as needed or if her condition were to change or worsen   Marny LowensteinJulie N Wenzel, PA-C  09/24/2014 2:33 PM

## 2014-11-11 ENCOUNTER — Encounter: Payer: Self-pay | Admitting: Certified Nurse Midwife

## 2014-12-02 ENCOUNTER — Encounter (HOSPITAL_COMMUNITY): Payer: Self-pay | Admitting: *Deleted

## 2014-12-02 ENCOUNTER — Inpatient Hospital Stay (HOSPITAL_COMMUNITY)
Admission: AD | Admit: 2014-12-02 | Discharge: 2014-12-03 | Disposition: A | Discharge status: Home / Self Care | Payer: Medicaid Other | Source: Ambulatory Visit | Attending: Obstetrics and Gynecology | Admitting: Obstetrics and Gynecology

## 2014-12-02 DIAGNOSIS — O9989 Other specified diseases and conditions complicating pregnancy, childbirth and the puerperium: Secondary | ICD-10-CM | POA: Insufficient documentation

## 2014-12-02 DIAGNOSIS — O26892 Other specified pregnancy related conditions, second trimester: Secondary | ICD-10-CM

## 2014-12-02 DIAGNOSIS — W57XXXA Bitten or stung by nonvenomous insect and other nonvenomous arthropods, initial encounter: Secondary | ICD-10-CM | POA: Insufficient documentation

## 2014-12-02 DIAGNOSIS — R519 Headache, unspecified: Secondary | ICD-10-CM

## 2014-12-02 DIAGNOSIS — Z3A16 16 weeks gestation of pregnancy: Secondary | ICD-10-CM | POA: Diagnosis not present

## 2014-12-02 DIAGNOSIS — R51 Headache: Secondary | ICD-10-CM | POA: Diagnosis present

## 2014-12-02 DIAGNOSIS — R1032 Left lower quadrant pain: Secondary | ICD-10-CM | POA: Diagnosis not present

## 2014-12-02 LAB — CBC
HEMATOCRIT: 31.2 % — AB (ref 36.0–46.0)
Hemoglobin: 10.8 g/dL — ABNORMAL LOW (ref 12.0–15.0)
MCH: 30.6 pg (ref 26.0–34.0)
MCHC: 34.6 g/dL (ref 30.0–36.0)
MCV: 88.4 fL (ref 78.0–100.0)
Platelets: 267 10*3/uL (ref 150–400)
RBC: 3.53 MIL/uL — AB (ref 3.87–5.11)
RDW: 12.8 % (ref 11.5–15.5)
WBC: 13.6 10*3/uL — ABNORMAL HIGH (ref 4.0–10.5)

## 2014-12-02 MED ORDER — ACETAMINOPHEN 500 MG PO TABS
1000.0000 mg | ORAL_TABLET | Freq: Once | ORAL | Status: AC
  Administered 2014-12-02: 1000 mg via ORAL
  Filled 2014-12-02: qty 2
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
## 2014-12-22 ENCOUNTER — Encounter: Payer: Medicaid Other | Attending: Family Medicine

## 2014-12-22 VITALS — Ht 62.0 in | Wt 201.6 lb

## 2014-12-22 DIAGNOSIS — Z713 Dietary counseling and surveillance: Secondary | ICD-10-CM | POA: Insufficient documentation

## 2014-12-22 DIAGNOSIS — O24419 Gestational diabetes mellitus in pregnancy, unspecified control: Secondary | ICD-10-CM

## 2014-12-23 NOTE — Progress Notes (Signed)
  Patient was seen on 12/22/14 for Gestational Diabetes self-management . The following learning objectives were met by the patient :   States the definition of Gestational Diabetes  States why dietary management is important in controlling blood glucose  Describes the effects of carbohydrates on blood glucose levels  Demonstrates ability to create a balanced meal plan  Demonstrates carbohydrate counting   States when to check blood glucose levels  Demonstrates proper blood glucose monitoring techniques  States the effect of stress and exercise on blood glucose levels  States the importance of limiting caffeine and abstaining from alcohol and smoking  Plan:  Aim for 2 Carb Choices per meal (30 grams) +/- 1 either way for breakfast Aim for 3 Carb Choices per meal (45 grams) +/- 1 either way from lunch and dinner Aim for 1-2 Carbs per snack Begin reading food labels for Total Carbohydrate and sugar grams of foods Consider  increasing your activity level by walking daily as tolerated Begin checking BG before breakfast and 2 hours after first bit of breakfast, lunch and dinner after  as directed by MD  Take medication  as directed by MD  Blood glucose monitor given: Accu-Chek [employer] Connect  Lot # [id number] Exp: 10/12/15 Blood glucose reading: 32m//dl  Patient instructed to monitor glucose levels: FBS: 60 - <90 2 hour: <120  Patient received the following handouts:  Nutrition Diabetes and Pregnancy  Carbohydrate Counting List  Meal Planning worksheet  Patient will be seen for follow-up as needed.

## 2015-02-01 ENCOUNTER — Encounter (HOSPITAL_COMMUNITY): Payer: Self-pay

## 2015-02-01 ENCOUNTER — Inpatient Hospital Stay (HOSPITAL_COMMUNITY)
Admission: AD | Admit: 2015-02-01 | Discharge: 2015-02-01 | Disposition: A | Discharge status: Home / Self Care | Payer: Medicaid Other | Source: Ambulatory Visit | Attending: Obstetrics and Gynecology | Admitting: Obstetrics and Gynecology

## 2015-02-01 DIAGNOSIS — R109 Unspecified abdominal pain: Secondary | ICD-10-CM | POA: Diagnosis present

## 2015-02-01 DIAGNOSIS — Z3A24 24 weeks gestation of pregnancy: Secondary | ICD-10-CM | POA: Insufficient documentation

## 2015-02-01 DIAGNOSIS — O2342 Unspecified infection of urinary tract in pregnancy, second trimester: Secondary | ICD-10-CM

## 2015-02-01 DIAGNOSIS — Z880 Allergy status to penicillin: Secondary | ICD-10-CM | POA: Insufficient documentation

## 2015-02-01 LAB — CBC
HCT: 29.9 % — ABNORMAL LOW (ref 36.0–46.0)
HEMOGLOBIN: 10.3 g/dL — AB (ref 12.0–15.0)
MCH: 30.6 pg (ref 26.0–34.0)
MCHC: 34.4 g/dL (ref 30.0–36.0)
MCV: 88.7 fL (ref 78.0–100.0)
Platelets: 260 10*3/uL (ref 150–400)
RBC: 3.37 MIL/uL — AB (ref 3.87–5.11)
RDW: 12.6 % (ref 11.5–15.5)
WBC: 12.9 10*3/uL — ABNORMAL HIGH (ref 4.0–10.5)

## 2015-02-01 LAB — URINALYSIS, ROUTINE W REFLEX MICROSCOPIC
Bilirubin Urine: NEGATIVE
Glucose, UA: NEGATIVE mg/dL
KETONES UR: 15 mg/dL — AB
Nitrite: NEGATIVE
PH: 6 (ref 5.0–8.0)
Protein, ur: NEGATIVE mg/dL
Specific Gravity, Urine: >1.03 — ABNORMAL HIGH (ref 1.005–1.030)
Urobilinogen, UA: 0.2 mg/dL (ref 0.0–1.0)

## 2015-02-01 LAB — URINE MICROSCOPIC-ADD ON

## 2015-02-01 MED ORDER — NITROFURANTOIN MONOHYD MACRO 100 MG PO CAPS: 100.0000 mg | ORAL_CAPSULE | Freq: Two times a day (BID) | ORAL | Status: DC

## 2015-02-01 MED ORDER — NITROFURANTOIN MONOHYD MACRO 100 MG PO CAPS: 100.0000 mg | ORAL_CAPSULE | Freq: Two times a day (BID) | ORAL | Status: AC

## 2015-02-01 NOTE — MAU Note (Signed)
Pt states she has had lower abd pain and vaginal pain x 4 days. Denies bleeding. Reports vaginal discharge and states she doesn't know if it is an infection. States she has been getting dizzy off/on for the last 2 weeks and saw the doctor last week and was told to increase her water intake.

## 2015-02-01 NOTE — Discharge Instructions (Signed)
Pregnancy and Urinary Tract Infection °A urinary tract infection (UTI) is a bacterial infection of the urinary tract. Infection of the urinary tract can include the ureters, kidneys (pyelonephritis), bladder (cystitis), and urethra (urethritis). All pregnant women should be screened for bacteria in the urinary tract. Identifying and treating a UTI will decrease the risk of preterm labor and developing more serious infections in both the mother and baby. °CAUSES °Bacteria germs cause almost all UTIs.  °RISK FACTORS °Many factors can increase your chances of getting a UTI during pregnancy. These include: °· Having a short urethra. °· Poor toilet and hygiene habits. °· Sexual intercourse. °· Blockage of urine along the urinary tract. °· Problems with the pelvic muscles or nerves. °· Diabetes. °· Obesity. °· Bladder problems after having several children. °· Previous history of UTI. °SIGNS AND SYMPTOMS  °· Pain, burning, or a stinging feeling when urinating. °· Suddenly feeling the need to urinate right away (urgency). °· Loss of bladder control (urinary incontinence). °· Frequent urination, more than is common with pregnancy. °· Lower abdominal or back discomfort. °· Cloudy urine. °· Blood in the urine (hematuria). °· Fever.  °When the kidneys are infected, the symptoms may be: °· Back pain. °· Flank pain on the right side more so than the left. °· Fever. °· Chills. °· Nausea. °· Vomiting. °DIAGNOSIS  °A urinary tract infection is usually diagnosed through urine tests. Additional tests and procedures are sometimes done. These may include: °· Ultrasound exam of the kidneys, ureters, bladder, and urethra. °· Looking in the bladder with a lighted tube (cystoscopy). °TREATMENT °Typically, UTIs can be treated with antibiotic medicines.  °HOME CARE INSTRUCTIONS  °· Only take over-the-counter or prescription medicines as directed by your health care provider. If you were prescribed antibiotics, take them as directed. Finish  them even if you start to feel better. °· Drink enough fluids to keep your urine clear or pale yellow. °· Do not have sexual intercourse until the infection is gone and your health care provider says it is okay. °· Make sure you are tested for UTIs throughout your pregnancy. These infections often come back.  °Preventing a UTI in the Future °· Practice good toilet habits. Always wipe from front to back. Use the tissue only once. °· Do not hold your urine. Empty your bladder as soon as possible when the urge comes. °· Do not douche or use deodorant sprays. °· Wash with soap and warm water around the genital area and the anus. °· Empty your bladder before and after sexual intercourse. °· Wear underwear with a cotton crotch. °· Avoid caffeine and carbonated drinks. They can irritate the bladder. °· Drink cranberry juice or take cranberry pills. This may decrease the risk of getting a UTI. °· Do not drink alcohol. °· Keep all your appointments and tests as scheduled.  °SEEK MEDICAL CARE IF:  °· Your symptoms get worse. °· You are still having fevers 2 or more days after treatment begins. °· You have a rash. °· You feel that you are having problems with medicines prescribed. °· You have abnormal vaginal discharge. °SEEK IMMEDIATE MEDICAL CARE IF:  °· You have back or flank pain. °· You have chills. °· You have blood in your urine. °· You have nausea and vomiting. °· You have contractions of your uterus. °· You have a gush of fluid from the vagina. °MAKE SURE YOU: °· Understand these instructions.   °· Will watch your condition.   °· Will get help right away if you are not doing   well or get worse.   °Document Released: 08/25/2010 Document Revised: 02/18/2013 Document Reviewed: 11/27/2012 °ExitCare® Patient Information ©2015 [employer]. This information is not intended to replace advice given to you by your health care provider. Make sure you discuss any questions you have with your health care provider. ° °
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
## 2015-02-20 ENCOUNTER — Encounter (HOSPITAL_COMMUNITY): Payer: Self-pay | Admitting: *Deleted

## 2015-02-20 ENCOUNTER — Inpatient Hospital Stay (HOSPITAL_COMMUNITY)
Admission: AD | Admit: 2015-02-20 | Discharge: 2015-02-20 | Disposition: A | Discharge status: Home / Self Care | Payer: Medicaid Other | Source: Ambulatory Visit | Attending: Obstetrics and Gynecology | Admitting: Obstetrics and Gynecology

## 2015-02-20 DIAGNOSIS — A5901 Trichomonal vulvovaginitis: Secondary | ICD-10-CM | POA: Diagnosis not present

## 2015-02-20 DIAGNOSIS — O99820 Streptococcus B carrier state complicating pregnancy: Secondary | ICD-10-CM | POA: Diagnosis not present

## 2015-02-20 DIAGNOSIS — F329 Major depressive disorder, single episode, unspecified: Secondary | ICD-10-CM | POA: Diagnosis not present

## 2015-02-20 DIAGNOSIS — Z3A27 27 weeks gestation of pregnancy: Secondary | ICD-10-CM | POA: Insufficient documentation

## 2015-02-20 DIAGNOSIS — N939 Abnormal uterine and vaginal bleeding, unspecified: Secondary | ICD-10-CM | POA: Diagnosis present

## 2015-02-20 DIAGNOSIS — O98312 Other infections with a predominantly sexual mode of transmission complicating pregnancy, second trimester: Secondary | ICD-10-CM | POA: Insufficient documentation

## 2015-02-20 DIAGNOSIS — A599 Trichomoniasis, unspecified: Secondary | ICD-10-CM

## 2015-02-20 LAB — WET PREP, GENITAL
CLUE CELLS WET PREP: NONE SEEN
YEAST WET PREP: NONE SEEN

## 2015-02-20 LAB — URINALYSIS, ROUTINE W REFLEX MICROSCOPIC
Bilirubin Urine: NEGATIVE
Glucose, UA: NEGATIVE mg/dL
KETONES UR: NEGATIVE mg/dL
Nitrite: NEGATIVE
PH: 7 (ref 5.0–8.0)
Protein, ur: NEGATIVE mg/dL
Specific Gravity, Urine: 1.015 (ref 1.005–1.030)
Urobilinogen, UA: 0.2 mg/dL (ref 0.0–1.0)

## 2015-02-20 LAB — URINE MICROSCOPIC-ADD ON

## 2015-02-20 MED ORDER — METRONIDAZOLE 500 MG PO TABS
2000.0000 mg | ORAL_TABLET | Freq: Once | ORAL | Status: AC
  Administered 2015-02-20: 2000 mg via ORAL
  Filled 2015-02-20: qty 4
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
## 2015-02-20 NOTE — MAU Note (Signed)
Pt states that she was here last week for abdominal pain and UTI-had some bleeding. Completed antibiotics-was prescribed pill for yeast infection. Still has a thick, white discharge-does not itch. Today she had some vaginal bleeding. Having some mild back pain rating 5/10. Did not take anything for pain. +FM

## 2015-02-20 NOTE — Discharge Instructions (Signed)
Trichomoniasis Trichomoniasis is an infection caused by an organism called Trichomonas. The infection can affect both women and men. In women, the outer female genitalia and the vagina are affected. In men, the penis is mainly affected, but the prostate and other reproductive organs can also be involved. Trichomoniasis is a sexually transmitted infection (STI) and is most often passed to another person through sexual contact.  RISK FACTORS  Having unprotected sexual intercourse.  Having sexual intercourse with an infected partner. SIGNS AND SYMPTOMS  Symptoms of trichomoniasis in women include:  Abnormal gray-green frothy vaginal discharge.  Itching and irritation of the vagina.  Itching and irritation of the area outside the vagina. Symptoms of trichomoniasis in men include:   Penile discharge with or without pain.  Pain during urination. This results from inflammation of the urethra. DIAGNOSIS  Trichomoniasis may be found during a Pap test or physical exam. Your health care provider may use one of the following methods to help diagnose this infection:  Testing the pH of the vagina with a test tape.  Using a vaginal swab test that checks for the Trichomonas organism. A test is available that provides results within a few minutes.  Examining a urine sample.  Testing vaginal secretions. Your health care provider may test you for other STIs, including HIV. TREATMENT   You may be given medicine to fight the infection. Women should inform their health care provider if they could be or are pregnant. Some medicines used to treat the infection should not be taken during pregnancy.  Your health care provider may recommend over-the-counter medicines or creams to decrease itching or irritation.  Your sexual partner will need to be treated if infected.  Your health care provider may test you for infection again 3 months after treatment. HOME CARE INSTRUCTIONS   Take medicines only as  directed by your health care provider.  Take over-the-counter medicine for itching or irritation as directed by your health care provider.  Do not have sexual intercourse while you have the infection.  Women should not douche or wear tampons while they have the infection.  Discuss your infection with your partner. Your partner may have gotten the infection from you, or you may have gotten it from your partner.  Have your sex partner get examined and treated if necessary.  Practice safe, informed, and protected sex.  See your health care provider for other STI testing. SEEK MEDICAL CARE IF:   You still have symptoms after you finish your medicine.  You develop abdominal pain.  You have pain when you urinate.  You have bleeding after sexual intercourse.  You develop a rash.  Your medicine makes you sick or makes you throw up (vomit). MAKE SURE YOU:  Understand these instructions.  Will watch your condition.  Will get help right away if you are not doing well or get worse.   This information is not intended to replace advice given to you by your health care provider. Make sure you discuss any questions you have with your health care provider.   Document Released: 10/24/2000 Document Revised: 05/21/2014 Document Reviewed: 02/09/2013 Elsevier Interactive Patient Education 2016 [employer].  

## 2015-02-21 LAB — GC/CHLAMYDIA PROBE AMP (~~LOC~~) NOT AT ARMC
Chlamydia: NEGATIVE
Neisseria Gonorrhea: NEGATIVE

## 2015-02-23 LAB — CULTURE, OB URINE

## 2015-04-21 ENCOUNTER — Other Ambulatory Visit: Payer: Self-pay | Admitting: Obstetrics and Gynecology

## 2015-05-15 ENCOUNTER — Encounter (HOSPITAL_COMMUNITY): Payer: Self-pay | Admitting: *Deleted

## 2015-05-15 ENCOUNTER — Inpatient Hospital Stay (HOSPITAL_COMMUNITY)
Admission: AD | Admit: 2015-05-15 | Discharge: 2015-05-15 | Disposition: A | Discharge status: Home / Self Care | Payer: Medicaid Other | Source: Ambulatory Visit | Attending: Obstetrics | Admitting: Obstetrics

## 2015-05-15 DIAGNOSIS — Z3A39 39 weeks gestation of pregnancy: Secondary | ICD-10-CM | POA: Insufficient documentation

## 2015-05-15 NOTE — MAU Note (Signed)
Pt stated she lost her mucus plug earlier to day. Started having ctx around 7pm. Denies SROm or bleeding.  Fetal movement less than usual tonight

## 2015-05-15 NOTE — L&D Delivery Note (Signed)
Patient was C/C/+2 and pushed for 45 minutes with epidural.   NSVD  female infant, Apgars 8,9, weight P.   The patient had no lacerations. Fundus was firm. EBL was expected amount. Placenta was delivered intact. Vagina was clear.  Baby was vigorous and doing skin to skin with mother.  Sandra Schultz A

## 2015-05-18 ENCOUNTER — Inpatient Hospital Stay (HOSPITAL_COMMUNITY): Payer: Medicaid Other | Admitting: Anesthesiology

## 2015-05-18 ENCOUNTER — Inpatient Hospital Stay (HOSPITAL_COMMUNITY)
Admission: AD | Admit: 2015-05-18 | Discharge: 2015-05-20 | DRG: 775 | Disposition: A | Discharge status: Home / Self Care | Payer: Medicaid Other | Source: Ambulatory Visit | Attending: Obstetrics and Gynecology | Admitting: Obstetrics and Gynecology

## 2015-05-18 ENCOUNTER — Encounter (HOSPITAL_COMMUNITY): Payer: Self-pay | Admitting: *Deleted

## 2015-05-18 DIAGNOSIS — Z6841 Body Mass Index (BMI) 40.0 and over, adult: Secondary | ICD-10-CM

## 2015-05-18 DIAGNOSIS — O99824 Streptococcus B carrier state complicating childbirth: Secondary | ICD-10-CM | POA: Diagnosis present

## 2015-05-18 DIAGNOSIS — O99214 Obesity complicating childbirth: Secondary | ICD-10-CM | POA: Diagnosis present

## 2015-05-18 DIAGNOSIS — O9962 Diseases of the digestive system complicating childbirth: Secondary | ICD-10-CM | POA: Diagnosis present

## 2015-05-18 DIAGNOSIS — K219 Gastro-esophageal reflux disease without esophagitis: Secondary | ICD-10-CM | POA: Diagnosis present

## 2015-05-18 DIAGNOSIS — Z3A40 40 weeks gestation of pregnancy: Secondary | ICD-10-CM | POA: Diagnosis not present

## 2015-05-18 LAB — CBC
HEMATOCRIT: 32.1 % — AB (ref 36.0–46.0)
Hemoglobin: 10.7 g/dL — ABNORMAL LOW (ref 12.0–15.0)
MCH: 27.6 pg (ref 26.0–34.0)
MCHC: 33.3 g/dL (ref 30.0–36.0)
MCV: 82.9 fL (ref 78.0–100.0)
Platelets: 286 10*3/uL (ref 150–400)
RBC: 3.87 MIL/uL (ref 3.87–5.11)
RDW: 14.3 % (ref 11.5–15.5)
WBC: 10 10*3/uL (ref 4.0–10.5)

## 2015-05-18 LAB — OB RESULTS CONSOLE HEPATITIS B SURFACE ANTIGEN: Hepatitis B Surface Ag: NEGATIVE

## 2015-05-18 LAB — OB RESULTS CONSOLE HIV ANTIBODY (ROUTINE TESTING): HIV: NONREACTIVE

## 2015-05-18 LAB — TYPE AND SCREEN
ABO/RH(D): O POS
ANTIBODY SCREEN: NEGATIVE

## 2015-05-18 LAB — OB RESULTS CONSOLE ABO/RH: RH Type: POSITIVE

## 2015-05-18 LAB — OB RESULTS CONSOLE ANTIBODY SCREEN: Antibody Screen: NEGATIVE

## 2015-05-18 LAB — OB RESULTS CONSOLE GBS: STREP GROUP B AG: POSITIVE

## 2015-05-18 LAB — OB RESULTS CONSOLE RPR: RPR: NONREACTIVE

## 2015-05-18 LAB — OB RESULTS CONSOLE RUBELLA ANTIBODY, IGM: Rubella: IMMUNE

## 2015-05-18 LAB — OB RESULTS CONSOLE GC/CHLAMYDIA
CHLAMYDIA, DNA PROBE: NEGATIVE
GC PROBE AMP, GENITAL: NEGATIVE

## 2015-05-18 MED ORDER — LACTATED RINGERS IV SOLN
500.0000 mL | INTRAVENOUS | Status: DC | PRN
  Administered 2015-05-18: 500 mL via INTRAVENOUS

## 2015-05-18 MED ORDER — LIDOCAINE HCL (PF) 1 % IJ SOLN
INTRAMUSCULAR | Status: DC | PRN
  Administered 2015-05-18 (×2): 4 mL

## 2015-05-18 MED ORDER — BUTORPHANOL TARTRATE 1 MG/ML IJ SOLN
1.0000 mg | INTRAMUSCULAR | Status: DC | PRN
  Administered 2015-05-18: 1 mg via INTRAVENOUS
  Filled 2015-05-18: qty 1

## 2015-05-18 MED ORDER — LIDOCAINE HCL (PF) 1 % IJ SOLN
30.0000 mL | INTRAMUSCULAR | Status: DC | PRN
  Filled 2015-05-18: qty 30

## 2015-05-18 MED ORDER — EPHEDRINE 5 MG/ML INJ
10.0000 mg | INTRAVENOUS | Status: DC | PRN
  Filled 2015-05-18: qty 2

## 2015-05-18 MED ORDER — PHENYLEPHRINE 40 MCG/ML (10ML) SYRINGE FOR IV PUSH (FOR BLOOD PRESSURE SUPPORT)
80.0000 ug | PREFILLED_SYRINGE | INTRAVENOUS | Status: DC | PRN
  Filled 2015-05-18: qty 2
  Filled 2015-05-18: qty 20

## 2015-05-18 MED ORDER — OXYTOCIN 10 UNIT/ML IJ SOLN
1.0000 m[IU]/min | INTRAVENOUS | Status: DC
  Administered 2015-05-18: 2 m[IU]/min via INTRAVENOUS

## 2015-05-18 MED ORDER — VANCOMYCIN HCL IN DEXTROSE 1-5 GM/200ML-% IV SOLN
1000.0000 mg | Freq: Two times a day (BID) | INTRAVENOUS | Status: DC
  Administered 2015-05-18: 1000 mg via INTRAVENOUS
  Filled 2015-05-18 (×3): qty 200

## 2015-05-18 MED ORDER — LACTATED RINGERS IV SOLN
INTRAVENOUS | Status: DC
  Administered 2015-05-18: 17:00:00 via INTRAVENOUS

## 2015-05-18 MED ORDER — OXYTOCIN 10 UNIT/ML IJ SOLN
2.5000 [IU]/h | INTRAMUSCULAR | Status: DC
  Administered 2015-05-19: 2.5 [IU]/h via INTRAVENOUS
  Filled 2015-05-18: qty 4

## 2015-05-18 MED ORDER — FLEET ENEMA 7-19 GM/118ML RE ENEM: 1.0000 | ENEMA | RECTAL | Status: DC | PRN

## 2015-05-18 MED ORDER — TERBUTALINE SULFATE 1 MG/ML IJ SOLN
0.2500 mg | Freq: Once | INTRAMUSCULAR | Status: DC | PRN
  Filled 2015-05-18: qty 1

## 2015-05-18 MED ORDER — OXYCODONE-ACETAMINOPHEN 5-325 MG PO TABS
2.0000 | ORAL_TABLET | ORAL | Status: DC | PRN
Start: 2015-05-18 — End: 2015-05-19

## 2015-05-18 MED ORDER — CITRIC ACID-SODIUM CITRATE 334-500 MG/5ML PO SOLN: 30.0000 mL | ORAL | Status: DC | PRN

## 2015-05-18 MED ORDER — OXYCODONE-ACETAMINOPHEN 5-325 MG PO TABS: 1.0000 | ORAL_TABLET | ORAL | Status: DC | PRN

## 2015-05-18 MED ORDER — ONDANSETRON HCL 4 MG/2ML IJ SOLN
4.0000 mg | Freq: Four times a day (QID) | INTRAMUSCULAR | Status: DC | PRN
  Administered 2015-05-18: 4 mg via INTRAVENOUS
  Filled 2015-05-18: qty 2

## 2015-05-18 MED ORDER — ACETAMINOPHEN 325 MG PO TABS
650.0000 mg | ORAL_TABLET | ORAL | Status: DC | PRN
Start: 2015-05-18 — End: 2015-05-19

## 2015-05-18 MED ORDER — DIPHENHYDRAMINE HCL 50 MG/ML IJ SOLN: 12.5000 mg | INTRAMUSCULAR | Status: DC | PRN

## 2015-05-18 MED ORDER — FENTANYL 2.5 MCG/ML BUPIVACAINE 1/10 % EPIDURAL INFUSION (WH - ANES)
14.0000 mL/h | INTRAMUSCULAR | Status: DC | PRN
  Administered 2015-05-18: 12.5 mL/h via EPIDURAL
  Filled 2015-05-18: qty 125

## 2015-05-18 MED ORDER — OXYTOCIN BOLUS FROM INFUSION: 500.0000 mL | INTRAVENOUS | Status: DC
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
## 2015-05-18 NOTE — Anesthesia Procedure Notes (Signed)
Epidural Patient location during procedure: OB  Staffing Anesthesiologist: Gracyn Allor Performed by: anesthesiologist   Preanesthetic Checklist Completed: patient identified, site marked, surgical consent, pre-op evaluation, timeout performed, IV checked, risks and benefits discussed and monitors and equipment checked  Epidural Patient position: sitting Prep: site prepped and draped and DuraPrep Patient monitoring: continuous pulse ox and blood pressure Approach: midline Location: L3-L4 Injection technique: LOR saline  Needle:  Needle type: Tuohy  Needle gauge: 17 G Needle length: 9 cm and 9 Needle insertion depth: 9 cm Catheter type: closed end flexible Catheter size: 19 Gauge Catheter at skin depth: 14 cm Test dose: negative  Assessment Events: blood not aspirated, injection not painful, no injection resistance, negative IV test and no paresthesia  Additional Notes Patient identified. Risks/Benefits/Options discussed with patient including but not limited to bleeding, infection, nerve damage, paralysis, failed block, incomplete pain control, headache, blood pressure changes, nausea, vomiting, reactions to medication both or allergic, itching and postpartum back pain. Confirmed with bedside nurse the patient's most recent platelet count. Confirmed with patient that they are not currently taking any anticoagulation, have any bleeding history or any family history of bleeding disorders. Patient expressed understanding and wished to proceed. All questions were answered. Sterile technique was used throughout the entire procedure. Please see nursing notes for vital signs. Test dose was given through epidural catheter and negative prior to continuing to dose epidural or start infusion. Warning signs of high block given to the patient including shortness of breath, tingling/numbness in hands, complete motor block, or any concerning symptoms with instructions to call for help. Patient was  given instructions on fall risk and not to get out of bed. All questions and concerns addressed with instructions to call with any issues or inadequate analgesia.      

## 2015-05-18 NOTE — Anesthesia Preprocedure Evaluation (Signed)
Anesthesia Evaluation  Patient identified by MRN, date of birth, ID band Patient awake    Reviewed: Allergy & Precautions, NPO status , Patient's Chart, lab work & pertinent test results  History of Anesthesia Complications Negative for: history of anesthetic complications  Airway Mallampati: III  TM Distance: >3 FB Neck ROM: Full    Dental no notable dental hx. (+) Dental Advisory Given   Pulmonary neg pulmonary ROS,    Pulmonary exam normal breath sounds clear to auscultation       Cardiovascular negative cardio ROS Normal cardiovascular exam Rhythm:Regular Rate:Normal     Neuro/Psych negative neurological ROS  negative psych ROS   GI/Hepatic Neg liver ROS, GERD  Medicated and Controlled,  Endo/Other  Morbid obesity  Renal/GU negative Renal ROS  negative genitourinary   Musculoskeletal negative musculoskeletal ROS (+)   Abdominal   Peds negative pediatric ROS (+)  Hematology negative hematology ROS (+)   Anesthesia Other Findings   Reproductive/Obstetrics (+) Pregnancy                             Anesthesia Physical Anesthesia Plan  ASA: III  Anesthesia Plan: Epidural   Post-op Pain Management:    Induction:   Airway Management Planned:   Additional Equipment:   Intra-op Plan:   Post-operative Plan:   Informed Consent: I have reviewed the patients History and Physical, chart, labs and discussed the procedure including the risks, benefits and alternatives for the proposed anesthesia with the patient or authorized representative who has indicated his/her understanding and acceptance.   Dental advisory given  Plan Discussed with: CRNA  Anesthesia Plan Comments:         Anesthesia Quick Evaluation

## 2015-05-19 ENCOUNTER — Encounter (HOSPITAL_COMMUNITY): Payer: Self-pay

## 2015-05-19 LAB — CBC
HEMATOCRIT: 32.5 % — AB (ref 36.0–46.0)
HEMOGLOBIN: 10.8 g/dL — AB (ref 12.0–15.0)
MCH: 27.7 pg (ref 26.0–34.0)
MCHC: 33.2 g/dL (ref 30.0–36.0)
MCV: 83.3 fL (ref 78.0–100.0)
Platelets: 284 10*3/uL (ref 150–400)
RBC: 3.9 MIL/uL (ref 3.87–5.11)
RDW: 14.3 % (ref 11.5–15.5)
WBC: 15.5 10*3/uL — ABNORMAL HIGH (ref 4.0–10.5)

## 2015-05-19 LAB — RPR: RPR: NONREACTIVE

## 2015-05-19 MED ORDER — BENZOCAINE-MENTHOL 20-0.5 % EX AERO: 1.0000 "application " | INHALATION_SPRAY | CUTANEOUS | Status: DC | PRN

## 2015-05-19 MED ORDER — INFLUENZA VAC SPLIT QUAD 0.5 ML IM SUSY: 0.5000 mL | PREFILLED_SYRINGE | INTRAMUSCULAR | Status: DC

## 2015-05-19 MED ORDER — SENNOSIDES-DOCUSATE SODIUM 8.6-50 MG PO TABS
2.0000 | ORAL_TABLET | ORAL | Status: DC
  Administered 2015-05-19 (×2): 2 via ORAL
  Filled 2015-05-19 (×2): qty 2

## 2015-05-19 MED ORDER — MEASLES, MUMPS & RUBELLA VAC ~~LOC~~ INJ
0.5000 mL | INJECTION | Freq: Once | SUBCUTANEOUS | Status: DC
  Filled 2015-05-19: qty 0.5

## 2015-05-19 MED ORDER — IBUPROFEN 800 MG PO TABS
800.0000 mg | ORAL_TABLET | Freq: Three times a day (TID) | ORAL | Status: DC
  Administered 2015-05-19 – 2015-05-20 (×5): 800 mg via ORAL
  Filled 2015-05-19 (×5): qty 1

## 2015-05-19 MED ORDER — LANOLIN HYDROUS EX OINT: TOPICAL_OINTMENT | CUTANEOUS | Status: DC | PRN

## 2015-05-19 MED ORDER — WITCH HAZEL-GLYCERIN EX PADS: 1.0000 "application " | MEDICATED_PAD | CUTANEOUS | Status: DC | PRN

## 2015-05-19 MED ORDER — TETANUS-DIPHTH-ACELL PERTUSSIS 5-2.5-18.5 LF-MCG/0.5 IM SUSP: 0.5000 mL | Freq: Once | INTRAMUSCULAR | Status: DC

## 2015-05-19 MED ORDER — ZOLPIDEM TARTRATE 5 MG PO TABS: 5.0000 mg | ORAL_TABLET | Freq: Every evening | ORAL | Status: DC | PRN

## 2015-05-19 MED ORDER — SIMETHICONE 80 MG PO CHEW: 80.0000 mg | CHEWABLE_TABLET | ORAL | Status: DC | PRN

## 2015-05-19 MED ORDER — SODIUM CHLORIDE 0.9 % IJ SOLN: 3.0000 mL | Freq: Two times a day (BID) | INTRAMUSCULAR | Status: DC

## 2015-05-19 MED ORDER — SODIUM CHLORIDE 0.9 % IV SOLN: 250.0000 mL | INTRAVENOUS | Status: DC | PRN

## 2015-05-19 MED ORDER — METHYLERGONOVINE MALEATE 0.2 MG/ML IJ SOLN: 0.2000 mg | INTRAMUSCULAR | Status: DC | PRN

## 2015-05-19 MED ORDER — PRENATAL MULTIVITAMIN CH
1.0000 | ORAL_TABLET | Freq: Every day | ORAL | Status: DC
  Administered 2015-05-20: 1 via ORAL
  Filled 2015-05-19: qty 1

## 2015-05-19 MED ORDER — ONDANSETRON HCL 4 MG PO TABS: 4.0000 mg | ORAL_TABLET | ORAL | Status: DC | PRN

## 2015-05-19 MED ORDER — DIPHENHYDRAMINE HCL 25 MG PO CAPS: 25.0000 mg | ORAL_CAPSULE | Freq: Four times a day (QID) | ORAL | Status: DC | PRN

## 2015-05-19 MED ORDER — MAGNESIUM HYDROXIDE 400 MG/5ML PO SUSP: 30.0000 mL | ORAL | Status: DC | PRN

## 2015-05-19 MED ORDER — ACETAMINOPHEN 325 MG PO TABS: 650.0000 mg | ORAL_TABLET | ORAL | Status: DC | PRN

## 2015-05-19 MED ORDER — SODIUM CHLORIDE 0.9 % IJ SOLN: 3.0000 mL | INTRAMUSCULAR | Status: DC | PRN

## 2015-05-19 MED ORDER — DIBUCAINE 1 % RE OINT: 1.0000 "application " | TOPICAL_OINTMENT | RECTAL | Status: DC | PRN

## 2015-05-19 MED ORDER — METHYLERGONOVINE MALEATE 0.2 MG PO TABS: 0.2000 mg | ORAL_TABLET | ORAL | Status: DC | PRN

## 2015-05-19 MED ORDER — ONDANSETRON HCL 4 MG/2ML IJ SOLN: 4.0000 mg | INTRAMUSCULAR | Status: DC | PRN

## 2015-05-19 MED ORDER — GUAIFENESIN 100 MG/5ML PO SYRP
200.0000 mg | ORAL_SOLUTION | Freq: Every day | ORAL | Status: DC | PRN
  Filled 2015-05-19: qty 10

## 2015-05-19 MED ORDER — FERROUS SULFATE 325 (65 FE) MG PO TABS
325.0000 mg | ORAL_TABLET | Freq: Two times a day (BID) | ORAL | Status: DC
  Administered 2015-05-19 (×2): 325 mg via ORAL
  Filled 2015-05-19 (×2): qty 1

## 2015-05-19 NOTE — Progress Notes (Signed)
Patient is doing well.  She is ambulating, voiding, tolerating PO.  Pain control is good.  Lochia is appropriate  Filed Vitals:   05/19/15 0130 05/19/15 0209 05/19/15 0255 05/19/15 0645  BP: 131/88 125/75 122/74 92/54  Pulse: 91 87 98 67  Temp:  99.3 F (37.4 C) 99.2 F (37.3 C) 98.8 F (37.1 C)  TempSrc:  Oral Oral Oral  Resp:  18 18 18   Height:      Weight:      SpO2:        NAD Fundus firm Ext: no edema  Lab Results  Component Value Date   WBC 15.5* 05/19/2015   HGB 10.8* 05/19/2015   HCT 32.5* 05/19/2015   MCV 83.3 05/19/2015   PLT 284 05/19/2015    --/--/O POS (01/04 1745)/RImmune  A/P 24 y.o. [id number] PPD#1 s/p TSVD. Routine care.   Expect d/c tomorrow.    [facility]NA GEFFEL [employer]

## 2015-05-19 NOTE — Lactation Note (Signed)
This note was copied from the chart of Sandra Schultz. Lactation Consultation Note  Patient Name: Sandra Link SnufferJessica Seidman WUJWJ'XToday's Date: 05/19/2015 Reason for consult: Initial assessment  Baby 13 hours, and has been to the breast for attempts , and feeding 10 - 15 mins.  Mom reports one of the feedings was on and off. Voided x 2 , 3 stools.  [facility] score 9 , Per mom has a back ached from back labor so is suing a heating pad .  LC assisted mom to a lying position for latching and showed mom hoe to hand express with colostrum  Noted . Baby latched with depth , swallows noted, increased with breast compressions for 15 mins, mom reports  Comfort with latch.  Per mom active with [place] [facility].  Plans to return to work at 6 weeks, LC reviewed options for providing breast milk. Also checking with WIC around the  3 week for a DEBP to prepare to return back to work. Mother informed of post-discharge support and given phone number to the lactation department, including services for  phone call assistance; out-patient appointments; and breastfeeding support group. List of other breastfeeding resources  in the community given in the handout. Encouraged mother to call for problems or concerns related to breastfeeding.   Maternal Data Has patient been taught Hand Expression?: Yes Does the patient have breastfeeding experience prior to this delivery?: Yes  Feeding Feeding Type: Breast Fed Length of feed: 15 min  LATCH Score/Interventions Latch: Grasps breast easily, tongue down, lips flanged, rhythmical sucking. Intervention(s): Skin to skin;Teach feeding cues;Waking techniques Intervention(s): Adjust position;Assist with latch;Breast massage;Breast compression  Audible Swallowing: Spontaneous and intermittent  Type of Nipple: Everted at rest and after stimulation  Comfort (Breast/Nipple): Soft / non-tender     Hold (Positioning): Assistance needed to  correctly position infant at breast and maintain latch. Intervention(s): Breastfeeding basics reviewed;Support Pillows;Position options;Skin to skin  LATCH Score: 9  Lactation Tools Discussed/Used WIC Program: Yes (per mom [place] )   Consult Status Consult Status: Follow-up Date: 05/20/15 Follow-up type: In-patient    Kathrin Greathouseorio, Tondalaya Perren Ann 05/19/2015, 1:04 PM

## 2015-05-19 NOTE — Anesthesia Postprocedure Evaluation (Signed)
Anesthesia Post Note  Patient: Sandra Schultz  Procedure(s) Performed: * No procedures listed *  Patient location during evaluation: Mother Baby Anesthesia Type: Epidural Level of consciousness: awake, awake and alert, oriented and patient cooperative Pain management: pain level controlled Vital Signs Assessment: post-procedure vital signs reviewed and stable Respiratory status: spontaneous breathing, nonlabored ventilation and respiratory function stable Cardiovascular status: stable Postop Assessment: no headache, no backache, patient able to bend at knees and no signs of nausea or vomiting Anesthetic complications: no    Last Vitals:  Filed Vitals:   05/19/15 0255 05/19/15 0645  BP: 122/74 92/54  Pulse: 98 67  Temp: 37.3 C 37.1 C  Resp: 18 18    Last Pain:  Filed Vitals:   05/19/15 0744  PainSc: 4                  [name]

## 2015-05-19 NOTE — Progress Notes (Signed)
UR chart review completed.  

## 2015-05-19 NOTE — Clinical Social Work Maternal (Signed)
CLINICAL SOCIAL WORK MATERNAL/CHILD NOTE  Patient Details  Name: Sandra Schultz MRN: [id number] Date of Birth: 08/10/1990  Date:  05/19/2015  Clinical Social Worker Initiating Note:  Sandra Schultz MSW, LCSW Date/ Time Initiated:  05/19/15/1230     Child's Name:  Sandra Schultz    Legal Guardian:  Sandra SnufferJessica Schultz   Need for Interpreter:  None   Date of Referral:  05/19/15     Reason for Referral:  History of depression  Referral Source:  RN   Address:  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  MOB presented as easily engaged and receptive to the visit. She displayed a full range in affect and was in a pleasant mood. MOB was providing skin to skin and was observed to be attempting to breastfeeding during the assessment.   MOB did not present with acute mental health symptoms.  MOB expressed appreciation for the visit, and stated that she is currently feeling "tired" and "exhuasted".  MOB endorsed long labor and delivery process, but shared that it improved once she received her epidural. MOB shared that the experience was similar to what she anticipated and expected, and reported feeling emotionally and physically  ready to be discharged home.  MOB stated that she lives alone with her 24 year old son, and now the infant. She stated that her mother and brother also live in [place], and her primary supports.  MOB stated that she takes online classes and works full-time. Per MOB, she only will receive 6 weeks of maternity leave, and stated that she has already made childcare arrangements for when she returns to work. Per MOB, the home is prepared for the infant, and she was noted to be smiling as she reflected upon the space she created and prepared for the infant.   MOB reported current strained/uncertain relationship with the FOB. She stated that she was talking to him 2 days prior to the infant's birth, informed him when she went into labor, and has not heard from him since. She shared that she is not surprised by his absence, since he displayed mixed levels of support during the pregnancy, and was uncertain if he was interested/ready to father this infant.  MOB shared belief that she is coping well with his absence, and stated that she feels confident in her ability to parent by herself. She stated that she has previously been a single parent, and appeared proud of her ability to prepare her home for the infant's arrival by herself.  MOB stated that she knows that she is capable of parenting by herself with the help of her brother and mother. She stated that she knows that the FOB is missing  out on a wonderful opportunity to parent with his indecisiveness about his readiness to parent, and stated that she is accepting his behaviors since it is outside of her control.    MOB confirmed history of depression that is noted in her chart from November 2013.  Per MOB, it was "situational" since she and the father of her 1st child were going through a break up. She stated that it was the end of a 6 year relationship, and shared that it was a difficult time for her.  MOB stated that she was prescribed medication, but reported  that she never filled the script.  MOB denied history of perinatal mood disorders, and denied presence of symptoms during this pregnancy. MOB acknowledged her increased risk due to prior history, balancing school, work, and being a single parent.  MOB presented as confident in her ability to cope with her stressors, and utilize her support system as needed.  She smiled as she reflected upon her personal strengths that have assisted her during the pregnancy, and acknowledged ongoing ability to continue amongst stress.   MOB denied questions, concerns, or needs at this time. She expressed appreciation for the visit, acknowledged ongoing CSW availability, and agreed to contact CSW if needs arise.   CSW Plan/Description:   1. Patient/Family Education- perinatal mood and anxiety disorders 2. No Further Intervention Required/No Barriers to Discharge    Kelby Fam 2015/09/06, 1:51 PM

## 2015-05-20 MED ORDER — DOCUSATE SODIUM 100 MG PO CAPS: 100.0000 mg | ORAL_CAPSULE | Freq: Two times a day (BID) | ORAL | Status: DC

## 2015-05-20 MED ORDER — IBUPROFEN 600 MG PO TABS: 600.0000 mg | ORAL_TABLET | Freq: Four times a day (QID) | ORAL | Status: DC | PRN

## 2015-05-20 MED ORDER — OXYCODONE-ACETAMINOPHEN 5-325 MG PO TABS: 1.0000 | ORAL_TABLET | ORAL | Status: DC | PRN

## 2015-05-20 NOTE — Discharge Summary (Signed)
Obstetric Discharge Summary Reason for Admission: onset of labor Prenatal Procedures: ultrasound Intrapartum Procedures: spontaneous vaginal delivery Postpartum Procedures: none Complications-Operative and Postpartum: none HEMOGLOBIN  Date Value Ref Range Status  05/19/2015 10.8* 12.0 - 15.0 g/dL Final   HCT  Date Value Ref Range Status  05/19/2015 32.5* 36.0 - 46.0 % Final    Physical Exam:  General: alert, cooperative and appears stated age 21Lochia: appropriate Uterine Fundus: firm  Discharge Diagnoses: Term Pregnancy-delivered  Discharge Information: Date: 05/20/2015 Activity: pelvic rest Diet: routine Medications: Ibuprofen, Colace and Percocet Condition: improved Instructions: refer to practice specific booklet Discharge to: home Follow-up Information    Follow up with HORVATH,MICHELLE A, MD In 4 weeks.   Specialty:  Obstetrics and Gynecology   Why:  For a postpartum evaluation   Contact information:   [address] [place] [address] [phone]       Newborn Data: Live born female  Birth Weight: 7 lb 5.4 oz (3328 g) APGAR: 9, 9  Home with mother.  Leonardo Plaia H. 05/20/2015, 9:10 AM

## 2015-05-20 NOTE — Progress Notes (Signed)
Patient states that OB MD told her she could drive herself home today as opposed to waiting on a ride this afternoon.

## 2015-05-20 NOTE — Lactation Note (Signed)
This note was copied from the chart of Sandra Schultz. Lactation ConsultationLink Snuffer Note  Patient Name: Sandra Link SnufferJessica Mish BMWUX'LToday's Date: 05/20/2015 Reason for consult: Follow-up assessment;Other (Comment) (5% weight loss, Bili check at 24 hours 5.4 )  Baby is 7634 hours old and dyad is for D/C today - baby breast feeding consistently 10 - 45 mins. Latch score range 3-12-20-08. Voids and stools adequate for age.  @ consult baby latched for 12 mins , with multiply swallows and mom was independent with latch.  Multiply swallows noted, increased with breast compressions , and mom reports comfort.  LC reviewed sore nipple and engorgement prevention and tx . LC instructed mom on the use comfort gels and breast shells , hand pump .  Referred to the baby and me booklet pages 24 -25.  Mother informed of post-discharge support and given phone number to the lactation department, including services for phone call assistance; out-patient appointments; and breastfeeding support group. List of other breastfeeding resources in the community given in the handout. Encouraged mother to call for problems or concerns related to breastfeeding.   Maternal Data Has patient been taught Hand Expression?: Yes  Feeding Feeding Type: Breast Fed Length of feed: 12 min (swallows noted )  LATCH Score/Interventions Latch: Grasps breast easily, tongue down, lips flanged, rhythmical sucking.  Audible Swallowing: Spontaneous and intermittent  Type of Nipple: Everted at rest and after stimulation  Comfort (Breast/Nipple): Soft / non-tender     Hold (Positioning): No assistance needed to correctly position infant at breast. Intervention(s): Breastfeeding basics reviewed;Support Pillows;Position options;Skin to skin  LATCH Score: 10  Lactation Tools Discussed/Used Tools: Shells;Comfort gels Shell Type: Inverted Breast pump type: Manual Pump Review: Setup, frequency, and cleaning;Milk Storage Initiated by:: MAI  Date  initiated:: 05/20/15   Consult Status Consult Status: Complete Date: 05/20/15    Kathrin Greathouseorio, Daria Mcmeekin Ann 05/20/2015, 10:26 AM

## 2015-06-25 ENCOUNTER — Inpatient Hospital Stay (HOSPITAL_COMMUNITY)
Admission: AD | Admit: 2015-06-25 | Payer: Medicaid Other | Source: Ambulatory Visit | Admitting: Obstetrics and Gynecology

## 2015-06-29 ENCOUNTER — Other Ambulatory Visit: Payer: Self-pay | Admitting: Obstetrics and Gynecology

## 2015-07-01 LAB — CYTOLOGY - PAP

## 2015-10-22 ENCOUNTER — Encounter (HOSPITAL_COMMUNITY): Payer: Self-pay | Admitting: *Deleted

## 2015-10-22 ENCOUNTER — Inpatient Hospital Stay (HOSPITAL_COMMUNITY)
Admission: AD | Admit: 2015-10-22 | Discharge: 2015-10-22 | Disposition: A | Discharge status: Home / Self Care | Payer: Medicaid Other | Source: Ambulatory Visit | Attending: Obstetrics and Gynecology | Admitting: Obstetrics and Gynecology

## 2015-10-22 DIAGNOSIS — R51 Headache: Secondary | ICD-10-CM | POA: Insufficient documentation

## 2015-10-22 DIAGNOSIS — Z3490 Encounter for supervision of normal pregnancy, unspecified, unspecified trimester: Secondary | ICD-10-CM

## 2015-10-22 DIAGNOSIS — O26891 Other specified pregnancy related conditions, first trimester: Secondary | ICD-10-CM | POA: Insufficient documentation

## 2015-10-22 DIAGNOSIS — O3680X0 Pregnancy with inconclusive fetal viability, not applicable or unspecified: Secondary | ICD-10-CM | POA: Diagnosis not present

## 2015-10-22 LAB — URINALYSIS, ROUTINE W REFLEX MICROSCOPIC
Bilirubin Urine: NEGATIVE
Glucose, UA: NEGATIVE mg/dL
Ketones, ur: 15 mg/dL — AB
Nitrite: NEGATIVE
Protein, ur: NEGATIVE mg/dL
Specific Gravity, Urine: 1.02 (ref 1.005–1.030)
pH: 6 (ref 5.0–8.0)

## 2015-10-22 LAB — POCT PREGNANCY, URINE: PREG TEST UR: POSITIVE — AB

## 2015-10-22 LAB — URINE MICROSCOPIC-ADD ON

## 2015-10-22 LAB — HCG, QUANTITATIVE, PREGNANCY: HCG, BETA CHAIN, QUANT, S: 4276 m[IU]/mL — AB (ref <5)

## 2015-10-22 MED ORDER — ACETAMINOPHEN 500 MG PO TABS
1000.0000 mg | ORAL_TABLET | Freq: Once | ORAL | Status: AC
  Administered 2015-10-22: 1000 mg via ORAL
  Filled 2015-10-22: qty 2

## 2015-10-22 NOTE — MAU Note (Signed)
Pt stated she ahd been feeling "horrible for about 3 days. C/o generalized body/jopints aching, very bad headache and nausea. Took pregnancy test and it was positive. Started having vag bleeding today.

## 2015-10-22 NOTE — Discharge Instructions (Signed)
First Trimester of Pregnancy The first trimester of pregnancy is from week 1 until the end of week 12 (months 1 through 3). A week after a sperm fertilizes an egg, the egg will implant on the wall of the uterus. This embryo will begin to develop into a baby. Genes from you and your partner are forming the baby. The female genes determine whether the baby is a boy or a girl. At 6-8 weeks, the eyes and face are formed, and the heartbeat can be seen on ultrasound. At the end of 12 weeks, all the baby's organs are formed.  Now that you are pregnant, you will want to do everything you can to have a healthy baby. Two of the most important things are to get good prenatal care and to follow your health care provider's instructions. Prenatal care is all the medical care you receive before the baby's birth. This care will help prevent, find, and treat any problems during the pregnancy and childbirth. BODY CHANGES Your body goes through many changes during pregnancy. The changes vary from woman to woman.   You may gain or lose a couple of pounds at first.  You may feel sick to your stomach (nauseous) and throw up (vomit). If the vomiting is uncontrollable, call your health care provider.  You may tire easily.  You may develop headaches that can be relieved by medicines approved by your health care provider.  You may urinate more often. Painful urination may mean you have a bladder infection.  You may develop heartburn as a result of your pregnancy.  You may develop constipation because certain hormones are causing the muscles that push waste through your intestines to slow down.  You may develop hemorrhoids or swollen, bulging veins (varicose veins).  Your breasts may begin to grow larger and become tender. Your nipples may stick out more, and the tissue that surrounds them (areola) may become darker.  Your gums may bleed and may be sensitive to brushing and flossing.  Dark spots or blotches (chloasma,  mask of pregnancy) may develop on your face. This will likely fade after the baby is born.  Your menstrual periods will stop.  You may have a loss of appetite.  You may develop cravings for certain kinds of food.  You may have changes in your emotions from day to day, such as being excited to be pregnant or being concerned that something may go wrong with the pregnancy and baby.  You may have more vivid and strange dreams.  You may have changes in your hair. These can include thickening of your hair, rapid growth, and changes in texture. Some women also have hair loss during or after pregnancy, or hair that feels dry or thin. Your hair will most likely return to normal after your baby is born. WHAT TO EXPECT AT YOUR PRENATAL VISITS During a routine prenatal visit:  You will be weighed to make sure you and the baby are growing normally.  Your blood pressure will be taken.  Your abdomen will be measured to track your baby's growth.  The fetal heartbeat will be listened to starting around week 10 or 12 of your pregnancy.  Test results from any previous visits will be discussed. Your health care provider may ask you:  How you are feeling.  If you are feeling the baby move.  If you have had any abnormal symptoms, such as leaking fluid, bleeding, severe headaches, or abdominal cramping.  If you are using any tobacco products,   including cigarettes, chewing tobacco, and electronic cigarettes.  If you have any questions. Other tests that may be performed during your first trimester include:  Blood tests to find your blood type and to check for the presence of any previous infections. They will also be used to check for low iron levels (anemia) and Rh antibodies. Later in the pregnancy, blood tests for diabetes will be done along with other tests if problems develop.  Urine tests to check for infections, diabetes, or protein in the urine.  An ultrasound to confirm the proper growth  and development of the baby.  An amniocentesis to check for possible genetic problems.  Fetal screens for spina bifida and Down syndrome.  You may need other tests to make sure you and the baby are doing well.  HIV (human immunodeficiency virus) testing. Routine prenatal testing includes screening for HIV, unless you choose not to have this test. HOME CARE INSTRUCTIONS  Medicines  Follow your health care provider's instructions regarding medicine use. Specific medicines may be either safe or unsafe to take during pregnancy.  Take your prenatal vitamins as directed.  If you develop constipation, try taking a stool softener if your health care provider approves. Diet  Eat regular, well-balanced meals. Choose a variety of foods, such as meat or vegetable-based protein, fish, milk and low-fat dairy products, vegetables, fruits, and whole grain breads and cereals. Your health care provider will help you determine the amount of weight gain that is right for you.  Avoid raw meat and uncooked cheese. These carry germs that can cause birth defects in the baby.  Eating four or five small meals rather than three large meals a day may help relieve nausea and vomiting. If you start to feel nauseous, eating a few soda crackers can be helpful. Drinking liquids between meals instead of during meals also seems to help nausea and vomiting.  If you develop constipation, eat more high-fiber foods, such as fresh vegetables or fruit and whole grains. Drink enough fluids to keep your urine clear or pale yellow. Activity and Exercise  Exercise only as directed by your health care provider. Exercising will help you:  Control your weight.  Stay in shape.  Be prepared for labor and delivery.  Experiencing pain or cramping in the lower abdomen or low back is a good sign that you should stop exercising. Check with your health care provider before continuing normal exercises.  Try to avoid standing for long  periods of time. Move your legs often if you must stand in one place for a long time.  Avoid heavy lifting.  Wear low-heeled shoes, and practice good posture.  You may continue to have sex unless your health care provider directs you otherwise. Relief of Pain or Discomfort  Wear a good support bra for breast tenderness.   Take warm sitz baths to soothe any pain or discomfort caused by hemorrhoids. Use hemorrhoid cream if your health care provider approves.   Rest with your legs elevated if you have leg cramps or low back pain.  If you develop varicose veins in your legs, wear support hose. Elevate your feet for 15 minutes, 3-4 times a day. Limit salt in your diet. Prenatal Care  Schedule your prenatal visits by the twelfth week of pregnancy. They are usually scheduled monthly at first, then more often in the last 2 months before delivery.  Write down your questions. Take them to your prenatal visits.  Keep all your prenatal visits as directed by your   health care provider. Safety  Wear your seat belt at all times when driving.  Make a list of emergency phone numbers, including numbers for family, friends, the hospital, and police and fire departments. General Tips  Ask your health care provider for a referral to a local prenatal education class. Begin classes no later than at the beginning of month 6 of your pregnancy.  Ask for help if you have counseling or nutritional needs during pregnancy. Your health care provider can offer advice or refer you to specialists for help with various needs.  Do not use hot tubs, steam rooms, or saunas.  Do not douche or use tampons or scented sanitary pads.  Do not cross your legs for long periods of time.  Avoid cat litter boxes and soil used by cats. These carry germs that can cause birth defects in the baby and possibly loss of the fetus by miscarriage or stillbirth.  Avoid all smoking, herbs, alcohol, and medicines not prescribed by  your health care provider. Chemicals in these affect the formation and growth of the baby.  Do not use any tobacco products, including cigarettes, chewing tobacco, and electronic cigarettes. If you need help quitting, ask your health care provider. You may receive counseling support and other resources to help you quit.  Schedule a dentist appointment. At home, brush your teeth with a soft toothbrush and be gentle when you floss. SEEK MEDICAL CARE IF:   You have dizziness.  You have mild pelvic cramps, pelvic pressure, or nagging pain in the abdominal area.  You have persistent nausea, vomiting, or diarrhea.  You have a bad smelling vaginal discharge.  You have pain with urination.  You notice increased swelling in your face, hands, legs, or ankles. SEEK IMMEDIATE MEDICAL CARE IF:   You have a fever.  You are leaking fluid from your vagina.  You have spotting or bleeding from your vagina.  You have severe abdominal cramping or pain.  You have rapid weight gain or loss.  You vomit blood or material that looks like coffee grounds.  You are exposed to German measles and have never had them.  You are exposed to fifth disease or chickenpox.  You develop a severe headache.  You have shortness of breath.  You have any kind of trauma, such as from a fall or a car accident.   This information is not intended to replace advice given to you by your health care provider. Make sure you discuss any questions you have with your health care provider.   Document Released: 04/24/2001 Document Revised: 05/21/2014 Document Reviewed: 03/10/2013 Elsevier Interactive Patient Education 2016 [employer]  
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
## 2015-10-24 ENCOUNTER — Encounter (HOSPITAL_COMMUNITY): Payer: Self-pay | Admitting: *Deleted

## 2015-10-24 ENCOUNTER — Inpatient Hospital Stay (HOSPITAL_COMMUNITY)
Admission: AD | Admit: 2015-10-24 | Discharge: 2015-10-24 | Disposition: A | Discharge status: Home / Self Care | Payer: Medicaid Other | Source: Ambulatory Visit | Attending: Obstetrics and Gynecology | Admitting: Obstetrics and Gynecology

## 2015-10-24 ENCOUNTER — Inpatient Hospital Stay (HOSPITAL_COMMUNITY): Payer: Medicaid Other

## 2015-10-24 DIAGNOSIS — R1030 Lower abdominal pain, unspecified: Secondary | ICD-10-CM | POA: Diagnosis present

## 2015-10-24 DIAGNOSIS — O209 Hemorrhage in early pregnancy, unspecified: Secondary | ICD-10-CM

## 2015-10-24 DIAGNOSIS — O4691 Antepartum hemorrhage, unspecified, first trimester: Secondary | ICD-10-CM | POA: Diagnosis not present

## 2015-10-24 DIAGNOSIS — O26899 Other specified pregnancy related conditions, unspecified trimester: Secondary | ICD-10-CM

## 2015-10-24 DIAGNOSIS — O219 Vomiting of pregnancy, unspecified: Secondary | ICD-10-CM

## 2015-10-24 DIAGNOSIS — E349 Endocrine disorder, unspecified: Secondary | ICD-10-CM

## 2015-10-24 DIAGNOSIS — O210 Mild hyperemesis gravidarum: Secondary | ICD-10-CM | POA: Diagnosis not present

## 2015-10-24 DIAGNOSIS — R7989 Other specified abnormal findings of blood chemistry: Secondary | ICD-10-CM

## 2015-10-24 DIAGNOSIS — Z3A01 Less than 8 weeks gestation of pregnancy: Secondary | ICD-10-CM | POA: Diagnosis not present

## 2015-10-24 DIAGNOSIS — R109 Unspecified abdominal pain: Secondary | ICD-10-CM

## 2015-10-24 DIAGNOSIS — Z880 Allergy status to penicillin: Secondary | ICD-10-CM | POA: Insufficient documentation

## 2015-10-24 DIAGNOSIS — O3680X0 Pregnancy with inconclusive fetal viability, not applicable or unspecified: Secondary | ICD-10-CM

## 2015-10-24 LAB — HCG, QUANTITATIVE, PREGNANCY: HCG, BETA CHAIN, QUANT, S: 8166 m[IU]/mL — AB (ref <5)

## 2015-10-24 MED ORDER — METOCLOPRAMIDE HCL 10 MG PO TABS: 10.0000 mg | ORAL_TABLET | Freq: Four times a day (QID) | ORAL | Status: DC

## 2015-10-24 NOTE — Discharge Instructions (Signed)
Morning Sickness °Morning sickness is when you feel sick to your stomach (nauseous) during pregnancy. This nauseous feeling may or may not come with vomiting. It often occurs in the morning but can be a problem any time of day. Morning sickness is most common during the first trimester, but it may continue throughout pregnancy. While morning sickness is unpleasant, it is usually harmless unless you develop severe and continual vomiting (hyperemesis gravidarum). This condition requires more intense treatment.  °CAUSES  °The cause of morning sickness is not completely known but seems to be related to normal hormonal changes that occur in pregnancy. °RISK FACTORS °You are at greater risk if you: °· Experienced nausea or vomiting before your pregnancy. °· Had morning sickness during a previous pregnancy. °· Are pregnant with more than one baby, such as twins. °TREATMENT  °Do not use any medicines (prescription, over-the-counter, or herbal) for morning sickness without first talking to your health care provider. Your health care provider may prescribe or recommend: °· Vitamin B6 supplements. °· Anti-nausea medicines. °· The herbal medicine ginger. °HOME CARE INSTRUCTIONS  °· Only take over-the-counter or prescription medicines as directed by your health care provider. °· Taking multivitamins before getting pregnant can prevent or decrease the severity of morning sickness in most women. °· Eat a piece of dry toast or unsalted crackers before getting out of bed in the morning. °· Eat five or six small meals a day. °· Eat dry and bland foods (rice, baked potato). Foods high in carbohydrates are often helpful. °· Do not drink liquids with your meals. Drink liquids between meals. °· Avoid greasy, fatty, and spicy foods. °· Get someone to cook for you if the smell of any food causes nausea and vomiting. °· If you feel nauseous after taking prenatal vitamins, take the vitamins at night or with a snack.  °· Snack on protein  foods (nuts, yogurt, cheese) between meals if you are hungry. °· Eat unsweetened gelatins for desserts. °· Wearing an acupressure wristband (worn for sea sickness) may be helpful. °· Acupuncture may be helpful. °· Do not smoke. °· Get a humidifier to keep the air in your house free of odors. °· Get plenty of fresh air. °SEEK MEDICAL CARE IF:  °· Your home remedies are not working, and you need medicine. °· You feel dizzy or lightheaded. °· You are losing weight. °SEEK IMMEDIATE MEDICAL CARE IF:  °· You have persistent and uncontrolled nausea and vomiting. °· You pass out (faint). °MAKE SURE YOU: °· Understand these instructions. °· Will watch your condition. °· Will get help right away if you are not doing well or get worse. °  °This information is not intended to replace advice given to you by your health care provider. Make sure you discuss any questions you have with your health care provider. °  °Document Released: 06/21/2006 Document Revised: 05/05/2013 Document Reviewed: 10/15/2012 °Elsevier Interactive Patient Education ©2016 [employer] ° ° °Abdominal Pain During Pregnancy °Abdominal pain is common in pregnancy. Most of the time, it does not cause harm. There are many causes of abdominal pain. Some causes are more serious than others. Some of the causes of abdominal pain in pregnancy are easily diagnosed. Occasionally, the diagnosis takes time to understand. Other times, the cause is not determined. Abdominal pain can be a sign that something is very wrong with the pregnancy, or the pain may have nothing to do with the pregnancy at all. For this reason, always tell your health care provider if you have   any abdominal discomfort. °HOME CARE INSTRUCTIONS  °Monitor your abdominal pain for any changes. The following actions may help to alleviate any discomfort you are experiencing: °· Do not have sexual intercourse or put anything in your vagina until your symptoms go away completely. °· Get plenty of rest  until your pain improves. °· Drink clear fluids if you feel nauseous. Avoid solid food as long as you are uncomfortable or nauseous. °· Only take over-the-counter or prescription medicine as directed by your health care provider. °· Keep all follow-up appointments with your health care provider. °SEEK IMMEDIATE MEDICAL CARE IF: °· You are bleeding, leaking fluid, or passing tissue from the vagina. °· You have increasing pain or cramping. °· You have persistent vomiting. °· You have painful or bloody urination. °· You have a fever. °· You notice a decrease in your baby's movements. °· You have extreme weakness or feel faint. °· You have shortness of breath, with or without abdominal pain. °· You develop a severe headache with abdominal pain. °· You have abnormal vaginal discharge with abdominal pain. °· You have persistent diarrhea. °· You have abdominal pain that continues even after rest, or gets worse. °MAKE SURE YOU:  °· Understand these instructions. °· Will watch your condition. °· Will get help right away if you are not doing well or get worse. °  °This information is not intended to replace advice given to you by your health care provider. Make sure you discuss any questions you have with your health care provider. °  °Document Released: 04/30/2005 Document Revised: 02/18/2013 Document Reviewed: 11/27/2012 °Elsevier Interactive Patient Education ©2016 Elsevier Inc. ° ° °

## 2015-10-24 NOTE — MAU Note (Signed)
Here for f/u hcg.  Little bit of pain on side.  Spotting noted this morning x1 when she wiped.  Really been nauseated, wondering if we can do something for that.

## 2015-10-24 NOTE — MAU Provider Note (Signed)
History   [id number]   Chief Complaint  Patient presents with  . Follow-up    HPI Sandra Schultz is a 25 y.o. female [phone] here for follow-up BHCG.  Upon review of the records patient was first seen on 6/10 for headache & nausea.   BHCG on that day was 4276.  Ultrasound was not done. Pt discharged home to f/u in MAU for [facility] & outpatient ultrasound ordered which is scheduled for 6/14. Pt here today reports lower abdominal cramping & spotting since last week. All other systems negative.    No LMP recorded. Patient is pregnant.  OB History  Gravida Para Term Preterm AB SAB TAB Ectopic Multiple Living  0 1 0 1 0 0 2    # Outcome Date GA Lbr Len/2nd Weight Sex Delivery Anes PTL Lv  4 Current           3 Term 05/18/15 [redacted]w[redacted]d 02:17 / 01:32 7 lb 5.4 oz (3.328 kg) F Vag-Spont EPI  Y  2 TAB           1 Term     M Vag-Spont   Y      Past Medical History  Diagnosis Date  . Seasonal allergies     Family History  Problem Relation Age of Onset  . Hypertension Mother   . Diabetes Father   . Arthritis    . Hypertension    . Diabetes    . Kidney disease      Social History   Social History  . Marital Status: Single    Spouse Name: N/A  . Number of Children: 1  . Years of Education: N/A   Occupational History  . wireless representative    Social History Main Topics  . Smoking status: Never Smoker   . Smokeless tobacco: Never Used  . Alcohol Use: Yes     Comment: occ, but not while pregnant  . Drug Use: No  . Sexual Activity: Yes    Birth Control/ Protection: None   Other Topics Concern  . Not on file   Social History Narrative    Allergies  Allergen Reactions  . Penicillins Other (See Comments)     Unknown; childhood reaction  Has patient had a PCN reaction causing immediate rash, facial/tongue/throat swelling, SOB or lightheadedness with hypotension: No Has patient had a PCN reaction causing severe rash involving mucus membranes or skin necrosis:  No Has patient had a PCN reaction that required hospitalization No Has patient had a PCN reaction occurring within the last 10 years: No If all of the above answers are "NO", then may proceed with Cephalosporin use.    . Pomegranate Extract Hives and Itching    No current facility-administered medications on file prior to encounter.   No current outpatient prescriptions on file prior to encounter.     Physical Exam   Filed Vitals:   10/24/15 1750  BP: 116/68  Pulse: 63  Temp: 98.3 F (36.8 C)  TempSrc: Oral  Resp: 16  Height:  (1.549 m)  Weight: 195 lb 6.4 oz (88.633 kg)    Physical Exam  Constitutional: She is oriented to person, place, and time. She appears well-developed and well-nourished. No distress.  HENT:  Head: Normocephalic and atraumatic.  Cardiovascular: Normal rate.   Respiratory: Effort normal. No respiratory distress.  GI: Soft. There is no tenderness.  Musculoskeletal: Normal range of motion.  Neurological: She is alert and oriented to person, place, and time.  Skin: She is not diaphoretic.  Psychiatric: She has a normal mood and affect. Her behavior is normal. Judgment and thought content normal.    MAU Course  Procedures Component     Latest Ref Rng 10/22/2015 10/24/2015  HCG, Beta Chain, Quant, S     <5 mIU/mL 4276 (H) 8166 (H)   [place] Ob Comp Less 14 Wks  10/24/2015  CLINICAL DATA:  Left lower quadrant pain for 2 days. Spotting. Beta HCG level, 8,166. Pregnancy of unknown anatomic location Z33.1 (ICD-10-CM) Abdominal pain affecting pregnancy O99.89, R10.9 (ICD-10-CM) Vaginal bleeding in pregnancy, first trimester O46.91 (ICD-10-CM) EXAM: OBSTETRIC <14 WK US AND TRANSVAGINAL OB US TECHNIQUE: Both transabdominal and transvaginal ultrasound examinations were performed for complete evaluation of the gestation as well as the maternal uterus, adnexal regions, and pelvic cul-de-sac. Transvaginal technique was performed to assess early pregnancy.  COMPARISON:  09/14/2014 FINDINGS: Intrauterine gestational sac: Yes Yolk sac:  Yes Embryo:  No Cardiac Activity: No MSD: 9.8  mm   5 w   5  d Subchorionic hemorrhage:  None visualized. Maternal uterus/adnexae: No uterine masses. Normal ovaries and adnexa. No pelvic free fluid. IMPRESSION: 1. Early intrauterine pregnancy with a gestational sac and yolk sac, but no visualized embryo or fetal heart activity. Recommend follow-up ultrasound and beta HCG in 7-10 days to document normal pregnancy progression. 2. No evidence of a pregnancy complication. Normal ovaries and adnexa. Electronically Signed   By: Amie Portlandavid  Ormond M.D.   On: 10/24/2015 20:16   [place] Ob Transvaginal  10/24/2015  CLINICAL DATA:  Left lower quadrant pain for 2 days. Spotting. Beta HCG level, 8,166. Pregnancy of unknown anatomic location Z33.1 (ICD-10-CM) Abdominal pain affecting pregnancy O99.89, R10.9 (ICD-10-CM) Vaginal bleeding in pregnancy, first trimester O46.91 (ICD-10-CM) EXAM: OBSTETRIC <14 WK US AND TRANSVAGINAL OB US TECHNIQUE: Both transabdominal and transvaginal ultrasound examinations were performed for complete evaluation of the gestation as well as the maternal uterus, adnexal regions, and pelvic cul-de-sac. Transvaginal technique was performed to assess early pregnancy. COMPARISON:  09/14/2014 FINDINGS: Intrauterine gestational sac: Yes Yolk sac:  Yes Embryo:  No Cardiac Activity: No MSD: 9.8  mm   5 w   5  d Subchorionic hemorrhage:  None visualized. Maternal uterus/adnexae: No uterine masses. Normal ovaries and adnexa. No pelvic free fluid. IMPRESSION: 1. Early intrauterine pregnancy with a gestational sac and yolk sac, but no visualized embryo or fetal heart activity. Recommend follow-up ultrasound and beta HCG in 7-10 days to document normal pregnancy progression. 2. No evidence of a pregnancy complication. Normal ovaries and adnexa. Electronically Signed   By: Amie Portlandavid  Ormond M.D.   On: 10/24/2015 20:16    MDM O  positive Appropriate rise in BHCG Ultrasound shows IUGS with yolk sac Will cancel ultrasound on 6/14 & reorder outpatient ultrasound in 1-2 weeks as recommended per radiologist  Assessment and Plan  25 y.o. [id number] at Unknown wks Pregnancy Follow-up BHCG 1. Elevated serum hCG   2. Nausea and vomiting during pregnancy prior to [redacted] weeks gestation   3. Pregnancy of unknown anatomic location   4. Vaginal bleeding in pregnancy, first trimester   5. Abdominal pain affecting pregnancy     P: Discharge home Rx reglan Outpatient ultrasound ordered [facility] Discussed reasons to return to MAU   Judeth HornErin Nivea Wojdyla, NP 10/24/2015 8:37 PM

## 2015-10-26 ENCOUNTER — Ambulatory Visit (HOSPITAL_COMMUNITY): Admission: RE | Admit: 2015-10-26 | Payer: Medicaid Other | Source: Ambulatory Visit

## 2015-10-31 ENCOUNTER — Encounter: Payer: Self-pay | Admitting: Medical

## 2015-10-31 ENCOUNTER — Ambulatory Visit (HOSPITAL_COMMUNITY)
Admission: RE | Admit: 2015-10-31 | Discharge: 2015-10-31 | Disposition: A | Discharge status: Home / Self Care | Payer: Medicaid Other | Source: Ambulatory Visit | Attending: Student | Admitting: Student

## 2015-10-31 ENCOUNTER — Ambulatory Visit (INDEPENDENT_AMBULATORY_CARE_PROVIDER_SITE_OTHER): Payer: Medicaid Other | Admitting: Medical

## 2015-10-31 DIAGNOSIS — O4691 Antepartum hemorrhage, unspecified, first trimester: Secondary | ICD-10-CM | POA: Diagnosis present

## 2015-10-31 DIAGNOSIS — Z3201 Encounter for pregnancy test, result positive: Secondary | ICD-10-CM

## 2015-10-31 DIAGNOSIS — O26899 Other specified pregnancy related conditions, unspecified trimester: Secondary | ICD-10-CM

## 2015-10-31 DIAGNOSIS — Z36 Encounter for antenatal screening of mother: Secondary | ICD-10-CM | POA: Insufficient documentation

## 2015-10-31 DIAGNOSIS — IMO0002 Reserved for concepts with insufficient information to code with codable children: Secondary | ICD-10-CM

## 2015-10-31 DIAGNOSIS — O219 Vomiting of pregnancy, unspecified: Secondary | ICD-10-CM | POA: Insufficient documentation

## 2015-10-31 DIAGNOSIS — O3680X0 Pregnancy with inconclusive fetal viability, not applicable or unspecified: Secondary | ICD-10-CM

## 2015-10-31 DIAGNOSIS — O9989 Other specified diseases and conditions complicating pregnancy, childbirth and the puerperium: Secondary | ICD-10-CM | POA: Diagnosis not present

## 2015-10-31 DIAGNOSIS — R109 Unspecified abdominal pain: Secondary | ICD-10-CM | POA: Diagnosis not present

## 2015-10-31 DIAGNOSIS — O209 Hemorrhage in early pregnancy, unspecified: Secondary | ICD-10-CM

## 2015-10-31 DIAGNOSIS — Z3A01 Less than 8 weeks gestation of pregnancy: Secondary | ICD-10-CM | POA: Diagnosis not present

## 2015-10-31 NOTE — Patient Instructions (Signed)
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
## 2015-11-10 ENCOUNTER — Ambulatory Visit (INDEPENDENT_AMBULATORY_CARE_PROVIDER_SITE_OTHER): Payer: Medicaid Other | Admitting: Obstetrics and Gynecology

## 2015-11-10 ENCOUNTER — Ambulatory Visit (HOSPITAL_COMMUNITY)
Admission: RE | Admit: 2015-11-10 | Discharge: 2015-11-10 | Disposition: A | Discharge status: Home / Self Care | Payer: Medicaid Other | Source: Ambulatory Visit | Attending: Medical | Admitting: Medical

## 2015-11-10 ENCOUNTER — Encounter: Payer: Self-pay | Admitting: Obstetrics and Gynecology

## 2015-11-10 DIAGNOSIS — Z23 Encounter for immunization: Secondary | ICD-10-CM | POA: Diagnosis present

## 2015-11-10 DIAGNOSIS — O209 Hemorrhage in early pregnancy, unspecified: Secondary | ICD-10-CM | POA: Diagnosis not present

## 2015-11-10 DIAGNOSIS — IMO0002 Reserved for concepts with insufficient information to code with codable children: Secondary | ICD-10-CM

## 2015-11-10 DIAGNOSIS — Z3490 Encounter for supervision of normal pregnancy, unspecified, unspecified trimester: Secondary | ICD-10-CM

## 2015-11-10 DIAGNOSIS — Z3A01 Less than 8 weeks gestation of pregnancy: Secondary | ICD-10-CM | POA: Diagnosis not present

## 2015-11-10 DIAGNOSIS — Z3201 Encounter for pregnancy test, result positive: Secondary | ICD-10-CM | POA: Diagnosis present
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
## 2015-11-10 NOTE — Patient Instructions (Signed)
[facility]

## 2015-11-13 LAB — OB RESULTS CONSOLE HIV ANTIBODY (ROUTINE TESTING): HIV: NONREACTIVE

## 2015-11-24 ENCOUNTER — Encounter (HOSPITAL_COMMUNITY): Payer: Self-pay

## 2015-11-24 ENCOUNTER — Inpatient Hospital Stay (HOSPITAL_COMMUNITY)
Admission: AD | Admit: 2015-11-24 | Discharge: 2015-11-24 | Disposition: A | Discharge status: Home / Self Care | Payer: Medicaid Other | Source: Ambulatory Visit | Attending: Obstetrics & Gynecology | Admitting: Obstetrics & Gynecology

## 2015-11-24 DIAGNOSIS — Z3A09 9 weeks gestation of pregnancy: Secondary | ICD-10-CM | POA: Insufficient documentation

## 2015-11-24 DIAGNOSIS — R42 Dizziness and giddiness: Secondary | ICD-10-CM | POA: Insufficient documentation

## 2015-11-24 DIAGNOSIS — B9689 Other specified bacterial agents as the cause of diseases classified elsewhere: Secondary | ICD-10-CM

## 2015-11-24 DIAGNOSIS — N760 Acute vaginitis: Secondary | ICD-10-CM

## 2015-11-24 DIAGNOSIS — O26891 Other specified pregnancy related conditions, first trimester: Secondary | ICD-10-CM | POA: Diagnosis present

## 2015-11-24 DIAGNOSIS — A5901 Trichomonal vulvovaginitis: Secondary | ICD-10-CM

## 2015-11-24 DIAGNOSIS — O210 Mild hyperemesis gravidarum: Secondary | ICD-10-CM

## 2015-11-24 DIAGNOSIS — Z880 Allergy status to penicillin: Secondary | ICD-10-CM | POA: Diagnosis not present

## 2015-11-24 DIAGNOSIS — R112 Nausea with vomiting, unspecified: Secondary | ICD-10-CM | POA: Diagnosis present

## 2015-11-24 DIAGNOSIS — R51 Headache: Secondary | ICD-10-CM | POA: Insufficient documentation

## 2015-11-24 DIAGNOSIS — G4489 Other headache syndrome: Secondary | ICD-10-CM

## 2015-11-24 LAB — WET PREP, GENITAL
SPERM: NONE SEEN
YEAST WET PREP: NONE SEEN

## 2015-11-24 LAB — URINE MICROSCOPIC-ADD ON

## 2015-11-24 LAB — URINALYSIS, ROUTINE W REFLEX MICROSCOPIC
Bilirubin Urine: NEGATIVE
Glucose, UA: NEGATIVE mg/dL
Hgb urine dipstick: NEGATIVE
KETONES UR: NEGATIVE mg/dL
NITRITE: NEGATIVE
PH: 6 (ref 5.0–8.0)
PROTEIN: NEGATIVE mg/dL
Specific Gravity, Urine: >1.03 — ABNORMAL HIGH (ref 1.005–1.030)

## 2015-11-24 LAB — COMPREHENSIVE METABOLIC PANEL
ALK PHOS: 60 U/L (ref 38–126)
ALT: 15 U/L (ref 14–54)
AST: 21 U/L (ref 15–41)
Albumin: 3.7 g/dL (ref 3.5–5.0)
Anion gap: 9 (ref 5–15)
BUN: 7 mg/dL (ref 6–20)
CALCIUM: 9.1 mg/dL (ref 8.9–10.3)
CHLORIDE: 103 mmol/L (ref 101–111)
CO2: 22 mmol/L (ref 22–32)
CREATININE: 0.52 mg/dL (ref 0.44–1.00)
GFR calc Af Amer: >60 mL/min (ref >60)
Glucose, Bld: 82 mg/dL (ref 65–99)
Potassium: 3.6 mmol/L (ref 3.5–5.1)
Sodium: 134 mmol/L — ABNORMAL LOW (ref 135–145)
Total Bilirubin: 0.7 mg/dL (ref 0.3–1.2)
Total Protein: 7.3 g/dL (ref 6.5–8.1)

## 2015-11-24 LAB — CBC
HCT: 35.7 % — ABNORMAL LOW (ref 36.0–46.0)
Hemoglobin: 12.5 g/dL (ref 12.0–15.0)
MCH: 30.2 pg (ref 26.0–34.0)
MCHC: 35 g/dL (ref 30.0–36.0)
MCV: 86.2 fL (ref 78.0–100.0)
PLATELETS: 264 10*3/uL (ref 150–400)
RBC: 4.14 MIL/uL (ref 3.87–5.11)
RDW: 13.1 % (ref 11.5–15.5)
WBC: 12.9 10*3/uL — ABNORMAL HIGH (ref 4.0–10.5)

## 2015-11-24 LAB — OB RESULTS CONSOLE GC/CHLAMYDIA: Gonorrhea: NEGATIVE

## 2015-11-24 MED ORDER — MECLIZINE HCL 25 MG PO TABS: 25.0000 mg | ORAL_TABLET | Freq: Three times a day (TID) | ORAL | Status: DC | PRN

## 2015-11-24 MED ORDER — METOCLOPRAMIDE HCL 10 MG PO TABS: 10.0000 mg | ORAL_TABLET | Freq: Four times a day (QID) | ORAL | Status: DC

## 2015-11-24 MED ORDER — PROMETHAZINE HCL 25 MG/ML IJ SOLN
INTRAVENOUS | Status: DC
  Administered 2015-11-24: 17:00:00 via INTRAVENOUS
  Filled 2015-11-24 (×6): qty 1000

## 2015-11-24 MED ORDER — PROMETHAZINE HCL 25 MG PO TABS: 25.0000 mg | ORAL_TABLET | Freq: Four times a day (QID) | ORAL | Status: DC | PRN

## 2015-11-24 MED ORDER — METRONIDAZOLE 500 MG PO TABS: 500.0000 mg | ORAL_TABLET | Freq: Two times a day (BID) | ORAL | Status: DC

## 2015-11-24 NOTE — MAU Note (Signed)
Pt repots she has been having increased n/v. Feeling very weak and dizzy at times. Called her doctors office and they told her to come here since she had not had her first appointment yet. Pt alos c/o a white vaginal discharge with odor

## 2015-11-24 NOTE — Discharge Instructions (Signed)
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
## 2015-11-25 LAB — GC/CHLAMYDIA PROBE AMP (~~LOC~~) NOT AT ARMC
Chlamydia: NEGATIVE
Neisseria Gonorrhea: NEGATIVE

## 2015-12-09 LAB — OB RESULTS CONSOLE RPR: RPR: NONREACTIVE

## 2016-01-06 ENCOUNTER — Other Ambulatory Visit: Payer: Self-pay | Admitting: Obstetrics

## 2016-01-10 ENCOUNTER — Encounter (HOSPITAL_COMMUNITY): Payer: Self-pay | Admitting: Emergency Medicine

## 2016-01-10 ENCOUNTER — Emergency Department (HOSPITAL_COMMUNITY)
Admission: EM | Admit: 2016-01-10 | Discharge: 2016-01-10 | Disposition: A | Discharge status: Home / Self Care | Payer: Medicaid Other | Attending: Emergency Medicine | Admitting: Emergency Medicine

## 2016-01-10 DIAGNOSIS — L02512 Cutaneous abscess of left hand: Secondary | ICD-10-CM | POA: Diagnosis not present

## 2016-01-10 DIAGNOSIS — Z79899 Other long term (current) drug therapy: Secondary | ICD-10-CM | POA: Diagnosis not present

## 2016-01-10 DIAGNOSIS — Z3A16 16 weeks gestation of pregnancy: Secondary | ICD-10-CM | POA: Diagnosis not present

## 2016-01-10 DIAGNOSIS — O26892 Other specified pregnancy related conditions, second trimester: Secondary | ICD-10-CM | POA: Insufficient documentation

## 2016-01-10 MED ORDER — ACETAMINOPHEN 500 MG PO TABS: 1000.0000 mg | ORAL_TABLET | Freq: Four times a day (QID) | ORAL | 0 refills | Status: DC | PRN

## 2016-01-10 MED ORDER — LIDOCAINE HCL 1 % IJ SOLN
INTRAMUSCULAR | Status: AC
  Administered 2016-01-10: 20 mL
  Filled 2016-01-10: qty 20

## 2016-01-10 MED ORDER — LIDOCAINE HCL (PF) 1 % IJ SOLN: 5.0000 mL | Freq: Once | INTRAMUSCULAR | Status: DC

## 2016-01-10 MED ORDER — CLINDAMYCIN HCL 150 MG PO CAPS: 450.0000 mg | ORAL_CAPSULE | Freq: Four times a day (QID) | ORAL | 0 refills | Status: DC

## 2016-01-10 NOTE — Discharge Instructions (Signed)
Please read and follow all provided instructions.  Your diagnoses today include:  1. Abscess of finger, left    Tests performed today include: Vital signs. See below for your results today.   Medications prescribed:   Take any prescribed medications only as directed.   Home care instructions:  Follow any educational materials contained in this packet  Follow-up instructions: Return to the Emergency Department in 48 hours for a recheck if your symptoms are not significantly improved.  Please follow-up with your primary care provider in the next 1 week for further evaluation of your symptoms.   Return instructions:  Return to the Emergency Department if you have: Fever Worsening symptoms Worsening pain Worsening swelling Redness of the skin that moves away from the affected area, especially if it streaks away from the affected area  Any other emergent concerns  Additional Information: If you have recurrent abscesses, try both the following. Use a Qtip to apply an over-the-counter antibiotic to the inside of your nostrils, twice a day for 5 days. Wash your body with over-the-counter Hibaclens once a day for one week and then once every two weeks. This can reduce the amount of bacterial on your skin that causes boils and lead to fewer boils. If you continue to have multiple or recurrent boils, you should see a dermatologist (skin doctor).   Your vital signs today were: BP 124/62 (BP Location: Right Arm)    Pulse 82    Temp 98.3 F (36.8 C) (Oral)    Resp 18    SpO2 100%  If your blood pressure (BP) was elevated above 135/85 this visit, please have this repeated by your doctor within one month. --------------
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
## 2016-03-09 ENCOUNTER — Inpatient Hospital Stay (HOSPITAL_COMMUNITY)
Admission: AD | Admit: 2016-03-09 | Discharge: 2016-03-09 | Disposition: A | Discharge status: Home / Self Care | Payer: Medicaid Other | Source: Ambulatory Visit | Attending: Obstetrics | Admitting: Obstetrics

## 2016-03-09 ENCOUNTER — Encounter (HOSPITAL_COMMUNITY): Payer: Self-pay

## 2016-03-09 DIAGNOSIS — Z3A24 24 weeks gestation of pregnancy: Secondary | ICD-10-CM | POA: Diagnosis not present

## 2016-03-09 DIAGNOSIS — Z880 Allergy status to penicillin: Secondary | ICD-10-CM | POA: Insufficient documentation

## 2016-03-09 DIAGNOSIS — O26892 Other specified pregnancy related conditions, second trimester: Secondary | ICD-10-CM | POA: Diagnosis not present

## 2016-03-09 DIAGNOSIS — R102 Pelvic and perineal pain: Secondary | ICD-10-CM | POA: Diagnosis not present

## 2016-03-09 DIAGNOSIS — R109 Unspecified abdominal pain: Secondary | ICD-10-CM | POA: Diagnosis not present

## 2016-03-09 DIAGNOSIS — O26899 Other specified pregnancy related conditions, unspecified trimester: Secondary | ICD-10-CM

## 2016-03-09 LAB — URINALYSIS, ROUTINE W REFLEX MICROSCOPIC
BILIRUBIN URINE: NEGATIVE
Glucose, UA: NEGATIVE mg/dL
HGB URINE DIPSTICK: NEGATIVE
Ketones, ur: NEGATIVE mg/dL
NITRITE: NEGATIVE
PROTEIN: NEGATIVE mg/dL
SPECIFIC GRAVITY, URINE: 1.01 (ref 1.005–1.030)
pH: 7 (ref 5.0–8.0)

## 2016-03-09 LAB — URINE MICROSCOPIC-ADD ON

## 2016-03-09 LAB — CBC
HCT: 30.5 % — ABNORMAL LOW (ref 36.0–46.0)
Hemoglobin: 10.5 g/dL — ABNORMAL LOW (ref 12.0–15.0)
MCH: 30.3 pg (ref 26.0–34.0)
MCHC: 34.4 g/dL (ref 30.0–36.0)
MCV: 87.9 fL (ref 78.0–100.0)
PLATELETS: 251 10*3/uL (ref 150–400)
RBC: 3.47 MIL/uL — AB (ref 3.87–5.11)
RDW: 12.6 % (ref 11.5–15.5)
WBC: 10.8 10*3/uL — AB (ref 4.0–10.5)
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
## 2016-03-09 NOTE — MAU Note (Addendum)
Pelvic pain since yesterday. Esp hurts to walk. Denies vag bleeding. Some white d/c which is not new. Pain is worse with walking and lifting

## 2016-03-09 NOTE — Discharge Instructions (Signed)
For round ligament pain, try a pregnancy support belt. Also try rest, heat/warm bath, and Tylenol.  Round Ligament Pain The round ligament is a cord of muscle and tissue that helps to support the uterus. It can become a source of pain during pregnancy if it becomes stretched or twisted as the baby grows. The pain usually begins in the second trimester of pregnancy, and it can come and go until the baby is delivered. It is not a serious problem, and it does not cause harm to the baby. Round ligament pain is usually a short, sharp, and pinching pain, but it can also be a dull, lingering, and aching pain. The pain is felt in the lower side of the abdomen or in the groin. It usually starts deep in the groin and moves up to the outside of the hip area. Pain can occur with:  A sudden change in position.  Rolling over in bed.  Coughing or sneezing.  Physical activity. HOME CARE INSTRUCTIONS Watch your condition for any changes. Take these steps to help with your pain:  When the pain starts, relax. Then try:  Sitting down.  Flexing your knees up to your abdomen.  Lying on your side with one pillow under your abdomen and another pillow between your legs.  Sitting in a warm bath for 15-20 minutes or until the pain goes away.  Take over-the-counter and prescription medicines only as told by your health care provider.  Move slowly when you sit and stand.  Avoid long walks if they cause pain.  Stop or lessen your physical activities if they cause pain. SEEK MEDICAL CARE IF:  Your pain does not go away with treatment.  You feel pain in your back that you did not have before.  Your medicine is not helping. SEEK IMMEDIATE MEDICAL CARE IF:  You develop a fever or chills.  You develop uterine contractions.  You develop vaginal bleeding.  You develop nausea or vomiting.  You develop diarrhea.  You have pain when you urinate.   This information is not intended to replace advice  given to you by your health care provider. Make sure you discuss any questions you have with your health care provider.   Document Released: 02/07/2008 Document Revised: 07/23/2011 Document Reviewed: 07/07/2014 Elsevier Interactive Patient Education [employer]2016 [employer]

## 2016-05-14 NOTE — L&D Delivery Note (Signed)
Delivery Note  Called to see patient for precipitous advancement in labor. The patient was admitted for induction and had pitocin 2 mu/min started approximately 2 hours prior to delivery. Her cvx was 2-3 cm dilated at that time. Approximately 30 minutes prior to delivery the patient was in significant increased pain. The patient was found to be 9 cm dilated with bloody show.   At 11:37 AM a viable and healthy female was delivered via  (Presentation: Direct Occiput; Anterior ).  APGAR:9 ,9 weight pending   Placenta status: Spintaneous ,intact .  Cord: 3V    Anesthesia:  None Episiotomy:  None Lacerations:  None Suture Repair: NA Est. Blood Loss (mL):  200 cc   Mom to postpartum.  Baby to Couplet care / Skin to Skin.  Marvion Bastidas H. 06/21/2016, 11:56 AM

## 2016-05-23 ENCOUNTER — Other Ambulatory Visit: Payer: Self-pay | Admitting: Obstetrics and Gynecology

## 2016-05-23 LAB — OB RESULTS CONSOLE GBS: STREP GROUP B AG: POSITIVE

## 2016-05-29 ENCOUNTER — Encounter (HOSPITAL_COMMUNITY): Payer: Self-pay | Admitting: *Deleted

## 2016-05-29 ENCOUNTER — Inpatient Hospital Stay (HOSPITAL_COMMUNITY): Payer: Medicaid Other

## 2016-05-29 ENCOUNTER — Inpatient Hospital Stay (HOSPITAL_COMMUNITY)
Admission: AD | Admit: 2016-05-29 | Discharge: 2016-05-29 | Disposition: A | Discharge status: Home / Self Care | Payer: Medicaid Other | Source: Ambulatory Visit | Attending: Obstetrics and Gynecology | Admitting: Obstetrics and Gynecology

## 2016-05-29 DIAGNOSIS — O368131 Decreased fetal movements, third trimester, fetus 1: Secondary | ICD-10-CM

## 2016-05-29 DIAGNOSIS — Z3A36 36 weeks gestation of pregnancy: Secondary | ICD-10-CM | POA: Insufficient documentation

## 2016-05-29 DIAGNOSIS — O368130 Decreased fetal movements, third trimester, not applicable or unspecified: Secondary | ICD-10-CM | POA: Diagnosis not present

## 2016-05-29 DIAGNOSIS — Z880 Allergy status to penicillin: Secondary | ICD-10-CM | POA: Insufficient documentation

## 2016-05-29 DIAGNOSIS — O4693 Antepartum hemorrhage, unspecified, third trimester: Secondary | ICD-10-CM | POA: Insufficient documentation

## 2016-05-29 LAB — URINALYSIS, ROUTINE W REFLEX MICROSCOPIC
BILIRUBIN URINE: NEGATIVE
Glucose, UA: NEGATIVE mg/dL
HGB URINE DIPSTICK: NEGATIVE
KETONES UR: NEGATIVE mg/dL
NITRITE: NEGATIVE
PROTEIN: 30 mg/dL — AB
Specific Gravity, Urine: 1.021 (ref 1.005–1.030)
pH: 7 (ref 5.0–8.0)

## 2016-05-29 NOTE — MAU Note (Signed)
Pt states she had sex last night and afterwards had a lot of bleeding.  Pt states that her underwear were soaked all the way up to the top of her underwear.  Pt states whenever she wiped the tissue was saturated.  Pt states she was having cramps last night.  Pt states the bleeding has slowed to spotting and now she is feeling pressure.  Pt also states she is unsure if she broke her water.  Pt states she felt the baby move when she got to the hospital.
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
## 2016-05-29 NOTE — Discharge Instructions (Signed)
Vaginal Bleeding During Pregnancy, Third Trimester A small amount of bleeding (spotting) from the vagina is relatively common in pregnancy. Various things can cause bleeding or spotting in pregnancy. Sometimes the bleeding is normal and is not a problem. However, bleeding during the third trimester can also be a sign of something serious for the mother and the baby. Be sure to tell your health care provider about any vaginal bleeding right away. Some possible causes of vaginal bleeding during the third trimester include:  The placenta may be partially or completely covering the opening to the cervix (placenta previa).  The placenta may have separated from the uterus (abruption of the placenta).  There may be an infection or growth on the cervix.  You may be starting labor, called discharging of the mucus plug.  The placenta may grow into the muscle layer of the uterus (placenta accreta). Follow these instructions at home: Watch your condition for any changes. The following actions may help to lessen any discomfort you are feeling:  Follow your health care provider's instructions for limiting your activity. If your health care provider orders bed rest, you may need to stay in bed and only get up to use the bathroom. However, your health care provider may allow you to continue light activity.  If needed, make plans for someone to help with your regular activities and responsibilities while you are on bed rest.  Keep track of the number of pads you use each day, how often you change pads, and how soaked (saturated) they are. Write this down.  Do not use tampons. Do not douche.  Do not have sexual intercourse or orgasms until approved by your health care provider.  Follow your health care provider's advice about lifting, driving, and physical activities.  If you pass any tissue from your vagina, save the tissue so you can show it to your health care provider.  Only take over-the-counter or  prescription medicines as directed by your health care provider.  Do not take aspirin because it can make you bleed.  Keep all follow-up appointments as directed by your health care provider. Contact a health care provider if:  You have any vaginal bleeding during any part of your pregnancy.  You have cramps or labor pains.  You have a fever, not controlled by medicine. Get help right away if:  You have severe cramps or pain in your back or belly (abdomen).  You have chills.  You have a gush of fluid from the vagina.  You pass large clots or tissue from your vagina.  Your bleeding increases.  You feel light-headed or weak.  You pass out.  You feel less movement or no movement of the baby. This information is not intended to replace advice given to you by your health care provider. Make sure you discuss any questions you have with your health care provider. Document Released: 07/21/2002 Document Revised: 10/06/2015 Document Reviewed: 01/05/2013 Elsevier Interactive Patient Education  2017 Elsevier Inc.      Introduction Patient Name: ________________________________________________ Patient Due Date: ____________________ What is a fetal movement count? A fetal movement count is the number of times that you feel your baby move during a certain amount of time. This may also be called a fetal kick count. A fetal movement count is recommended for every pregnant woman. You may be asked to start counting fetal movements as early as week 28 of your pregnancy. Pay attention to when your baby is most active. You may notice your baby's sleep  and wake cycles. You may also notice things that make your baby move more. You should do a fetal movement count:  When your baby is normally most active.  At the same time each day. A good time to count movements is while you are resting, after having something to eat and drink. How do I count fetal movements? 1. Find a quiet, comfortable  area. Sit, or lie down on your side. 2. Write down the date, the start time and stop time, and the number of movements that you felt between those two times. Take this information with you to your health care visits. 3. For 2 hours, count kicks, flutters, swishes, rolls, and jabs. You should feel at least 10 movements during 2 hours. 4. You may stop counting after you have felt 10 movements. 5. If you do not feel 10 movements in 2 hours, have something to eat and drink. Then, keep resting and counting for 1 hour. If you feel at least 4 movements during that hour, you may stop counting. Contact a health care provider if:  You feel fewer than 4 movements in 2 hours.  Your baby is not moving like he or she usually does. Date: ____________ Start time: ____________ Stop time: ____________ Movements: ____________ Date: ____________ Start time: ____________ Stop time: ____________ Movements: ____________ Date: ____________ Start time: ____________ Stop time: ____________ Movements: ____________ Date: ____________ Start time: ____________ Stop time: ____________ Movements: ____________ Date: ____________ Start time: ____________ Stop time: ____________ Movements: ____________ Date: ____________ Start time: ____________ Stop time: ____________ Movements: ____________ Date: ____________ Start time: ____________ Stop time: ____________ Movements: ____________ Date: ____________ Start time: ____________ Stop time: ____________ Movements: ____________ Date: ____________ Start time: ____________ Stop time: ____________ Movements: ____________ This information is not intended to replace advice given to you by your health care provider. Make sure you discuss any questions you have with your health care provider. Document Released: 05/30/2006 Document Revised: 12/28/2015 Document Reviewed: 06/09/2015 Elsevier Interactive Patient Education  2017 [employer]

## 2016-06-15 ENCOUNTER — Inpatient Hospital Stay (HOSPITAL_COMMUNITY)
Admission: AD | Admit: 2016-06-15 | Discharge: 2016-06-15 | Disposition: A | Discharge status: Home / Self Care | Payer: Medicaid Other | Source: Ambulatory Visit | Attending: Obstetrics and Gynecology | Admitting: Obstetrics and Gynecology

## 2016-06-15 ENCOUNTER — Encounter (HOSPITAL_COMMUNITY): Payer: Self-pay | Admitting: *Deleted

## 2016-06-15 DIAGNOSIS — Z3493 Encounter for supervision of normal pregnancy, unspecified, third trimester: Secondary | ICD-10-CM | POA: Diagnosis present

## 2016-06-15 HISTORY — DX: Gastro-esophageal reflux disease without esophagitis: K21.9

## 2016-06-15 HISTORY — DX: Headache: R51

## 2016-06-15 HISTORY — DX: Headache, unspecified: R51.9

## 2016-06-15 NOTE — MAU Note (Signed)
Pt can be triaged in room 163 per Candis SchatzH. Koran RN

## 2016-06-21 ENCOUNTER — Inpatient Hospital Stay (HOSPITAL_COMMUNITY): Payer: Medicaid Other | Admitting: Anesthesiology

## 2016-06-21 ENCOUNTER — Inpatient Hospital Stay (HOSPITAL_COMMUNITY)
Admission: AD | Admit: 2016-06-21 | Discharge: 2016-06-23 | DRG: 775 | Disposition: A | Discharge status: Home / Self Care | Payer: Medicaid Other | Source: Ambulatory Visit | Attending: Obstetrics and Gynecology | Admitting: Obstetrics and Gynecology

## 2016-06-21 ENCOUNTER — Encounter (HOSPITAL_COMMUNITY): Payer: Self-pay | Admitting: *Deleted

## 2016-06-21 DIAGNOSIS — Z8249 Family history of ischemic heart disease and other diseases of the circulatory system: Secondary | ICD-10-CM | POA: Diagnosis not present

## 2016-06-21 DIAGNOSIS — Z880 Allergy status to penicillin: Secondary | ICD-10-CM

## 2016-06-21 DIAGNOSIS — Z6839 Body mass index (BMI) 39.0-39.9, adult: Secondary | ICD-10-CM | POA: Diagnosis not present

## 2016-06-21 DIAGNOSIS — O9962 Diseases of the digestive system complicating childbirth: Secondary | ICD-10-CM | POA: Diagnosis present

## 2016-06-21 DIAGNOSIS — Z3A39 39 weeks gestation of pregnancy: Secondary | ICD-10-CM

## 2016-06-21 DIAGNOSIS — O99214 Obesity complicating childbirth: Secondary | ICD-10-CM | POA: Diagnosis present

## 2016-06-21 DIAGNOSIS — K219 Gastro-esophageal reflux disease without esophagitis: Secondary | ICD-10-CM | POA: Diagnosis present

## 2016-06-21 DIAGNOSIS — Z833 Family history of diabetes mellitus: Secondary | ICD-10-CM

## 2016-06-21 DIAGNOSIS — O99824 Streptococcus B carrier state complicating childbirth: Secondary | ICD-10-CM | POA: Diagnosis present

## 2016-06-21 DIAGNOSIS — Z3490 Encounter for supervision of normal pregnancy, unspecified, unspecified trimester: Secondary | ICD-10-CM

## 2016-06-21 DIAGNOSIS — Z3493 Encounter for supervision of normal pregnancy, unspecified, third trimester: Secondary | ICD-10-CM | POA: Diagnosis present

## 2016-06-21 LAB — CBC
HCT: 35.9 % — ABNORMAL LOW (ref 36.0–46.0)
HEMOGLOBIN: 12.7 g/dL (ref 12.0–15.0)
MCH: 30.2 pg (ref 26.0–34.0)
MCHC: 35.4 g/dL (ref 30.0–36.0)
MCV: 85.3 fL (ref 78.0–100.0)
PLATELETS: 234 10*3/uL (ref 150–400)
RBC: 4.21 MIL/uL (ref 3.87–5.11)
RDW: 14.4 % (ref 11.5–15.5)
WBC: 7 10*3/uL (ref 4.0–10.5)

## 2016-06-21 LAB — TYPE AND SCREEN
ABO/RH(D): O POS
ANTIBODY SCREEN: NEGATIVE

## 2016-06-21 MED ORDER — EPHEDRINE 5 MG/ML INJ
10.0000 mg | INTRAVENOUS | Status: DC | PRN
  Filled 2016-06-21: qty 4

## 2016-06-21 MED ORDER — OXYCODONE-ACETAMINOPHEN 5-325 MG PO TABS
1.0000 | ORAL_TABLET | ORAL | Status: DC | PRN
  Administered 2016-06-21 – 2016-06-22 (×2): 1 via ORAL
  Filled 2016-06-21 (×2): qty 1

## 2016-06-21 MED ORDER — BENZOCAINE-MENTHOL 20-0.5 % EX AERO
1.0000 "application " | INHALATION_SPRAY | CUTANEOUS | Status: DC | PRN
  Filled 2016-06-21: qty 56

## 2016-06-21 MED ORDER — CEFAZOLIN IN D5W 1 GM/50ML IV SOLN: 1.0000 g | Freq: Three times a day (TID) | INTRAVENOUS | Status: DC

## 2016-06-21 MED ORDER — METHYLERGONOVINE MALEATE 0.2 MG PO TABS: 0.2000 mg | ORAL_TABLET | ORAL | Status: DC | PRN

## 2016-06-21 MED ORDER — ONDANSETRON HCL 4 MG PO TABS: 4.0000 mg | ORAL_TABLET | ORAL | Status: DC | PRN

## 2016-06-21 MED ORDER — ONDANSETRON HCL 4 MG/2ML IJ SOLN: 4.0000 mg | Freq: Four times a day (QID) | INTRAMUSCULAR | Status: DC | PRN

## 2016-06-21 MED ORDER — ZOLPIDEM TARTRATE 5 MG PO TABS: 5.0000 mg | ORAL_TABLET | Freq: Every evening | ORAL | Status: DC | PRN

## 2016-06-21 MED ORDER — TETANUS-DIPHTH-ACELL PERTUSSIS 5-2.5-18.5 LF-MCG/0.5 IM SUSP: 0.5000 mL | Freq: Once | INTRAMUSCULAR | Status: DC

## 2016-06-21 MED ORDER — METHYLERGONOVINE MALEATE 0.2 MG/ML IJ SOLN: 0.2000 mg | INTRAMUSCULAR | Status: DC | PRN

## 2016-06-21 MED ORDER — LACTATED RINGERS IV SOLN: 500.0000 mL | INTRAVENOUS | Status: DC | PRN

## 2016-06-21 MED ORDER — LIDOCAINE HCL (PF) 1 % IJ SOLN
30.0000 mL | INTRAMUSCULAR | Status: DC | PRN
  Filled 2016-06-21: qty 30

## 2016-06-21 MED ORDER — DIBUCAINE 1 % RE OINT: 1.0000 "application " | TOPICAL_OINTMENT | RECTAL | Status: DC | PRN

## 2016-06-21 MED ORDER — FENTANYL 2.5 MCG/ML BUPIVACAINE 1/10 % EPIDURAL INFUSION (WH - ANES)
14.0000 mL/h | INTRAMUSCULAR | Status: DC | PRN
  Filled 2016-06-21: qty 100

## 2016-06-21 MED ORDER — TERBUTALINE SULFATE 1 MG/ML IJ SOLN
0.2500 mg | Freq: Once | INTRAMUSCULAR | Status: DC | PRN
  Filled 2016-06-21: qty 1

## 2016-06-21 MED ORDER — OXYTOCIN BOLUS FROM INFUSION
500.0000 mL | Freq: Once | INTRAVENOUS | Status: AC
  Administered 2016-06-21: 500 mL via INTRAVENOUS

## 2016-06-21 MED ORDER — LACTATED RINGERS IV SOLN
500.0000 mL | Freq: Once | INTRAVENOUS | Status: AC
  Administered 2016-06-21: 500 mL via INTRAVENOUS

## 2016-06-21 MED ORDER — VANCOMYCIN HCL IN DEXTROSE 1-5 GM/200ML-% IV SOLN
1000.0000 mg | Freq: Two times a day (BID) | INTRAVENOUS | Status: DC
  Administered 2016-06-21: 1000 mg via INTRAVENOUS
  Filled 2016-06-21 (×3): qty 200

## 2016-06-21 MED ORDER — ACETAMINOPHEN 325 MG PO TABS
650.0000 mg | ORAL_TABLET | ORAL | Status: DC | PRN
  Administered 2016-06-21 – 2016-06-23 (×3): 650 mg via ORAL
  Filled 2016-06-21 (×3): qty 2

## 2016-06-21 MED ORDER — OXYCODONE-ACETAMINOPHEN 5-325 MG PO TABS: 2.0000 | ORAL_TABLET | ORAL | Status: DC | PRN

## 2016-06-21 MED ORDER — SOD CITRATE-CITRIC ACID 500-334 MG/5ML PO SOLN: 30.0000 mL | ORAL | Status: DC | PRN

## 2016-06-21 MED ORDER — DIPHENHYDRAMINE HCL 50 MG/ML IJ SOLN: 12.5000 mg | INTRAMUSCULAR | Status: DC | PRN

## 2016-06-21 MED ORDER — OXYTOCIN 40 UNITS IN LACTATED RINGERS INFUSION - SIMPLE MED: 2.5000 [IU]/h | INTRAVENOUS | Status: DC

## 2016-06-21 MED ORDER — PRENATAL MULTIVITAMIN CH
1.0000 | ORAL_TABLET | Freq: Every day | ORAL | Status: DC
  Administered 2016-06-22: 1 via ORAL
  Filled 2016-06-21: qty 1

## 2016-06-21 MED ORDER — PHENYLEPHRINE 40 MCG/ML (10ML) SYRINGE FOR IV PUSH (FOR BLOOD PRESSURE SUPPORT)
80.0000 ug | PREFILLED_SYRINGE | INTRAVENOUS | Status: DC | PRN
  Filled 2016-06-21: qty 10
  Filled 2016-06-21: qty 5

## 2016-06-21 MED ORDER — ONDANSETRON HCL 4 MG/2ML IJ SOLN: 4.0000 mg | INTRAMUSCULAR | Status: DC | PRN

## 2016-06-21 MED ORDER — OXYCODONE-ACETAMINOPHEN 5-325 MG PO TABS: 1.0000 | ORAL_TABLET | ORAL | Status: DC | PRN

## 2016-06-21 MED ORDER — IBUPROFEN 600 MG PO TABS
600.0000 mg | ORAL_TABLET | Freq: Four times a day (QID) | ORAL | Status: DC
  Administered 2016-06-21 – 2016-06-23 (×7): 600 mg via ORAL
  Filled 2016-06-21 (×8): qty 1

## 2016-06-21 MED ORDER — SIMETHICONE 80 MG PO CHEW: 80.0000 mg | CHEWABLE_TABLET | ORAL | Status: DC | PRN

## 2016-06-21 MED ORDER — COCONUT OIL OIL: 1.0000 "application " | TOPICAL_OIL | Status: DC | PRN

## 2016-06-21 MED ORDER — DIPHENHYDRAMINE HCL 25 MG PO CAPS: 25.0000 mg | ORAL_CAPSULE | Freq: Four times a day (QID) | ORAL | Status: DC | PRN

## 2016-06-21 MED ORDER — PHENYLEPHRINE 40 MCG/ML (10ML) SYRINGE FOR IV PUSH (FOR BLOOD PRESSURE SUPPORT)
80.0000 ug | PREFILLED_SYRINGE | INTRAVENOUS | Status: DC | PRN
  Filled 2016-06-21: qty 5

## 2016-06-21 MED ORDER — ACETAMINOPHEN 325 MG PO TABS: 650.0000 mg | ORAL_TABLET | ORAL | Status: DC | PRN

## 2016-06-21 MED ORDER — CEFAZOLIN SODIUM-DEXTROSE 2-4 GM/100ML-% IV SOLN: 2.0000 g | Freq: Once | INTRAVENOUS | Status: DC

## 2016-06-21 MED ORDER — OXYTOCIN 40 UNITS IN LACTATED RINGERS INFUSION - SIMPLE MED
1.0000 m[IU]/min | INTRAVENOUS | Status: DC
  Administered 2016-06-21: 2 m[IU]/min via INTRAVENOUS
  Filled 2016-06-21: qty 1000

## 2016-06-21 MED ORDER — BUTORPHANOL TARTRATE 1 MG/ML IJ SOLN
1.0000 mg | INTRAMUSCULAR | Status: DC | PRN
  Administered 2016-06-21 (×2): 1 mg via INTRAVENOUS
  Filled 2016-06-21 (×2): qty 1

## 2016-06-21 MED ORDER — WITCH HAZEL-GLYCERIN EX PADS: 1.0000 "application " | MEDICATED_PAD | CUTANEOUS | Status: DC | PRN

## 2016-06-21 MED ORDER — SENNOSIDES-DOCUSATE SODIUM 8.6-50 MG PO TABS
2.0000 | ORAL_TABLET | ORAL | Status: DC
  Administered 2016-06-21 – 2016-06-22 (×2): 2 via ORAL
  Filled 2016-06-21 (×2): qty 2

## 2016-06-21 MED ORDER — LACTATED RINGERS IV SOLN
INTRAVENOUS | Status: DC
  Administered 2016-06-21: 08:00:00 via INTRAVENOUS

## 2016-06-21 NOTE — Lactation Note (Addendum)
Lactation Consultation Note  Patient Name: Sandra PoliteJessica E Strausser ZOXWR'UToday's Date: 06/21/2016 Reason for consult: Initial assessment   Initial consult ~30 minutes after birth; GA 8239.2;  Mom is a P3 with limited experience breastfeeding 2 previous children (2-3 weeks with first; 3 months with second). Infant was STS with mom with LC entered room.  LC asked if mom would like assistance with latching; mom consented.  Infant was rooting.  Taught hand expression with return demonstration and observation of colostrum easily expressed. Mom attempted latching in cradle hold but was having difficulty.  LC suggested cross-cradle but mom became frustrated and said she did not like this hold, so LC suggested switching back to cradle hold. LC assisted with the latching in cradle hold.  Infant took a few sucks and then paused; stimulation needed to get him to suck again.   After ~5 minutes, mom said "this is too much, I just spit him out" and wanted to take the infant off the breast.  LC suggested that mom put infant upright on her chest STS. Mom also stated that she is exhausted and would not remember anything LC was telling her.  LC offered to give her a book with breastfeeding information in it since mom thought LC brochure was breastfeeding info.   Breastfeeding book given to mom. LC reassured mom that we would visit again later.  Told mom to call the nurse or LC if she wanted help with latching again. Lactation brochure left at bedside along with breastfeeding book.     Maternal Data Has patient been taught Hand Expression?: Yes Does the patient have breastfeeding experience prior to this delivery?: Yes  Feeding Feeding Type: Breast Fed Length of feed: 5 min (mom wanted to take infant off d/t exhaustion)  LATCH Score/Interventions Latch: Repeated attempts needed to sustain latch, nipple held in mouth throughout feeding, stimulation needed to elicit sucking reflex. Intervention(s): Adjust position;Assist with  latch;Breast compression  Audible Swallowing: None Intervention(s): Hand expression  Type of Nipple: Everted at rest and after stimulation  Comfort (Breast/Nipple): Soft / non-tender     Hold (Positioning): Assistance needed to correctly position infant at breast and maintain latch.  LATCH Score: 6  Lactation Tools Discussed/Used WIC Program: Yes   Consult Status Consult Status: Follow-up Date: 06/22/16 Follow-up type: In-patient    Lendon KaVann, Dazja Houchin Walker 06/21/2016, 12:39 PM

## 2016-06-21 NOTE — Anesthesia Pain Management Evaluation Note (Signed)
  CRNA Pain Management Visit Note  Patient: Sandra PoliteJessica E Schultz, 26 y.o., female  "Hello I am a member of the anesthesia team at [facility]. We have an anesthesia team available at all times to provide care throughout the hospital, including epidural management and anesthesia for C-section. I don't know your plan for the delivery whether it a natural birth, water birth, IV sedation, nitrous supplementation, doula or epidural, but we want to meet your pain goals."   1.Was your pain managed to your expectations on prior hospitalizations?   Yes   2.What is your expectation for pain management during this hospitalization?     Epidural  3.How can we help you reach that goal?   Record the patient's initial score and the patient's pain goal.   Pain: 0  Pain Goal: 6 [facility] wants you to be able to say your pain was always managed very well.  Laban EmperorMalinova,Makaelah Cranfield Hristova 06/21/2016

## 2016-06-21 NOTE — Anesthesia Postprocedure Evaluation (Signed)
Anesthesia Post Note  Patient: Sandra Schultz  Procedure(s) Performed: * No procedures listed *  Patient location during evaluation: L&D Anesthesia Type: Epidural Level of consciousness: patient uncooperative Pain management: pain level not controlled Respiratory status: spontaneous breathing Cardiovascular status: stable Anesthetic complications: no Comments: Patient delivered before epidural could be performed.        Last Vitals:  Vitals:   06/21/16 0928 06/21/16 1025  BP: 107/75 126/87  Pulse: 64 78  Resp: 18 20  Temp:      Last Pain:  Vitals:   06/21/16 1048  TempSrc:   PainSc: 9    Pain Goal: Patients Stated Pain Goal: 6 (06/21/16 0853)               Daryana Whirley JR,JOHN Susann GivensFRANKLIN

## 2016-06-21 NOTE — Anesthesia Preprocedure Evaluation (Signed)
Anesthesia Evaluation  Patient identified by MRN, date of birth, ID band Patient awake    Reviewed: Allergy & Precautions, H&P , NPO status , Patient's Chart, lab work & pertinent test results  History of Anesthesia Complications Negative for: history of anesthetic complications  Airway Mallampati: II  TM Distance: >3 FB Neck ROM: full    Dental no notable dental hx.    Pulmonary neg pulmonary ROS,    Pulmonary exam normal        Cardiovascular negative cardio ROS Normal cardiovascular exam     Neuro/Psych negative neurological ROS  negative psych ROS   GI/Hepatic Neg liver ROS, GERD  Medicated and Controlled,  Endo/Other  Morbid obesity  Renal/GU negative Renal ROS  negative genitourinary   Musculoskeletal   Abdominal (+) + obese,   Peds negative pediatric ROS (+)  Hematology negative hematology ROS (+)   Anesthesia Other Findings   Reproductive/Obstetrics (+) Pregnancy                             Anesthesia Physical  Anesthesia Plan  ASA: III  Anesthesia Plan: Epidural   Post-op Pain Management:    Induction:   Airway Management Planned:   Additional Equipment:   Intra-op Plan:   Post-operative Plan:   Informed Consent: I have reviewed the patients History and Physical, chart, labs and discussed the procedure including the risks, benefits and alternatives for the proposed anesthesia with the patient or authorized representative who has indicated his/her understanding and acceptance.     Plan Discussed with:   Anesthesia Plan Comments:         Anesthesia Quick Evaluation
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
## 2016-06-22 LAB — CBC
HEMATOCRIT: 29.8 % — AB (ref 36.0–46.0)
HEMOGLOBIN: 10.3 g/dL — AB (ref 12.0–15.0)
MCH: 29.4 pg (ref 26.0–34.0)
MCHC: 34.6 g/dL (ref 30.0–36.0)
MCV: 85.1 fL (ref 78.0–100.0)
Platelets: 200 10*3/uL (ref 150–400)
RBC: 3.5 MIL/uL — ABNORMAL LOW (ref 3.87–5.11)
RDW: 14.5 % (ref 11.5–15.5)
WBC: 8.7 10*3/uL (ref 4.0–10.5)

## 2016-06-22 LAB — RPR: RPR Ser Ql: NONREACTIVE

## 2016-06-22 NOTE — Progress Notes (Signed)
UR chart review completed.  

## 2016-06-22 NOTE — Progress Notes (Signed)
Patient is eating, ambulating, voiding.  Pain control is good.  Vitals:   06/21/16 1346 06/21/16 1450 06/21/16 1850 06/22/16 0400  BP: (!) 108/52 (!) 96/54 105/62 (!) 97/58  Pulse: (!) 55 (!) 59 61 (!) 58  Resp:  20 18 16   Temp: 98.2 F (36.8 C) 98.9 F (37.2 C) 99.5 F (37.5 C) 98.5 F (36.9 C)  TempSrc:   Oral Oral  SpO2:    99%  Weight:      Height:        Fundus firm Perineum without swelling.  Lab Results  Component Value Date   WBC 8.7 06/22/2016   HGB 10.3 (L) 06/22/2016   HCT 29.8 (L) 06/22/2016   MCV 85.1 06/22/2016   PLT 200 06/22/2016    --/--/O POS (02/08 0825)/RI  A/P Post partum day 1.  Routine care.  Expect d/c routine.    Joanie Duprey A

## 2016-06-22 NOTE — Lactation Note (Signed)
This note was copied from a baby's chart. Lactation Consultation Note  Patient Name: Sandra Schultz ZOXWR'UToday's Date: 06/22/2016 Reason for consult: Follow-up assessment   Follow up with mom of 27 hour old infant. Infant with 2 BF for 10-30 minutes, 2 attempts, 4 voids and 3 stools in last 24 hours. Infant weight 7 lb 12.3 oz with 2% weight loss since birth. LATCH scores 8-9.  Mom reports BF is off and on. She reports she needs assistance with positioning. She latched infant to right breast in the side lying cradle hold. Infant latched easily and deeply with intermittent swallows. Changed diaper and then assisted mom in latching infant to left breast in the cross cradle hold. Infant latched easily with active feeding and intermittent swallows. Mom reports she feels better about latching infant. Discussed awakening techniques with feeding as needed.   Mom with compressible breasts and areola with everted nipples. Showed mom how to hand express and colostrum was visible. Manual pump given with instructions for use and cleaning for home use. Mom appreciative of LC assistance.    Maternal Data Formula Feeding for Exclusion: No Has patient been taught Hand Expression?: Yes  Feeding Feeding Type: Breast Fed Length of feed: 15 min  LATCH Score/Interventions Latch: Grasps breast easily, tongue down, lips flanged, rhythmical sucking. Intervention(s): Adjust position;Assist with latch;Breast massage;Breast compression  Audible Swallowing: Spontaneous and intermittent  Type of Nipple: Everted at rest and after stimulation  Comfort (Breast/Nipple): Soft / non-tender     Hold (Positioning): Assistance needed to correctly position infant at breast and maintain latch. Intervention(s): Breastfeeding basics reviewed;Support Pillows;Position options;Skin to skin  LATCH Score: 9  Lactation Tools Discussed/Used     Consult Status Consult Status: Follow-up Date: 06/23/16 Follow-up type:  In-patient    Silas FloodSharon S Sheryn Aldaz 06/22/2016, 4:31 PM

## 2016-06-22 NOTE — Discharge Summary (Signed)
Obstetric Discharge Summary Reason for Admission: induction of labor Prenatal Procedures: NST Intrapartum Procedures: spontaneous vaginal delivery Postpartum Procedures: none Complications-Operative and Postpartum: none Hemoglobin  Date Value Ref Range Status  06/22/2016 10.3 (L) 12.0 - 15.0 g/dL Final   HCT  Date Value Ref Range Status  06/22/2016 29.8 (L) 36.0 - 46.0 % Final     Discharge Diagnoses: Term Pregnancy-delivered  Discharge Information: Date: 06/22/2016 Activity: pelvic rest Diet: routine Medications: Ibuprofen Condition: stable Instructions: refer to practice specific booklet Discharge to: home Follow-up Information    Schultz,Sandra H., MD Follow up in 4 week(s).   Specialty:  Obstetrics and Gynecology Contact information: [address] [place] [id number] [phone]           Newborn Data: Live born female  Birth Weight: 7 lb 15.3 oz (3610 g) APGAR: 9, 9  Home with mother.  [name] A 06/22/2016, 7:53 AM

## 2016-06-23 NOTE — Progress Notes (Signed)
Patient is eating, ambulating, voiding.  Pain control is good.  Vitals:   06/21/16 1850 06/22/16 0400 06/22/16 1744 06/23/16 0534  BP: 105/62 (!) 97/58 119/74 102/70  Pulse: 61 (!) 58 62 70  Resp: 18 16 17 20   Temp: 99.5 F (37.5 C) 98.5 F (36.9 C) 98.3 F (36.8 C) 99.2 F (37.3 C)  TempSrc: Oral Oral Oral   SpO2:  99%    Weight:      Height:        Fundus firm Perineum without swelling.  Lab Results  Component Value Date   WBC 8.7 06/22/2016   HGB 10.3 (L) 06/22/2016   HCT 29.8 (L) 06/22/2016   MCV 85.1 06/22/2016   PLT 200 06/22/2016    --/--/O POS (02/08 0825)/RI  A/P Post partum day 2.  Routine care.  Expect d/c today.    Almond Fitzgibbon A

## 2016-06-23 NOTE — Lactation Note (Signed)
This note was copied from a baby's chart. Lactation Consultation Note  Patient Name: Sandra Link SnufferJessica Massimo UYQIH'KToday's Date: 06/23/2016 Reason for consult: Follow-up assessmentwith this mom of a term baby, now 1046 hours old, and breast feeding going well. Baby is cluster feeding. I observed mom latching baby, and suggested she try cross cradle to obtain a deeper latch, and then switch to cradle.[name] was able to latch much deeper this way, and mom ws able to see the difference.  I also reviewed hand expression with mom, and she has easily expressed clear colostrum. Breast care and lactation services reviewed with mom. Mom very receptive to teaching, and knows t call for questions/conerns.    Maternal Data    Feeding Feeding Type: Breast Fed Length of feed:  (latched at 1025, still feeding)  LATCH Score/Interventions Latch: Repeated attempts needed to sustain latch, nipple held in mouth throughout feeding, stimulation needed to elicit sucking reflex. Intervention(s): Adjust position;Assist with latch;Breast compression  Audible Swallowing: Spontaneous and intermittent  Type of Nipple: Flat  Comfort (Breast/Nipple): Soft / non-tender     Hold (Positioning): Assistance needed to correctly position infant at breast and maintain latch. Intervention(s): Breastfeeding basics reviewed;Support Pillows;Position options  LATCH Score: 7  Lactation Tools Discussed/Used     Consult Status Consult Status: Complete Follow-up type: Call as needed    Alfred LevinsLee, Dvid Pendry Anne 06/23/2016, 10:30 AM

## 2016-10-16 ENCOUNTER — Encounter (HOSPITAL_COMMUNITY): Payer: Self-pay | Admitting: Emergency Medicine

## 2016-10-16 ENCOUNTER — Emergency Department (HOSPITAL_COMMUNITY)
Admission: EM | Admit: 2016-10-16 | Discharge: 2016-10-16 | Disposition: A | Discharge status: Home / Self Care | Payer: Medicaid Other | Attending: Emergency Medicine | Admitting: Emergency Medicine

## 2016-10-16 ENCOUNTER — Emergency Department (HOSPITAL_COMMUNITY): Payer: Medicaid Other

## 2016-10-16 DIAGNOSIS — Y999 Unspecified external cause status: Secondary | ICD-10-CM | POA: Insufficient documentation

## 2016-10-16 DIAGNOSIS — Y9389 Activity, other specified: Secondary | ICD-10-CM | POA: Diagnosis not present

## 2016-10-16 DIAGNOSIS — S161XXA Strain of muscle, fascia and tendon at neck level, initial encounter: Secondary | ICD-10-CM | POA: Diagnosis not present

## 2016-10-16 DIAGNOSIS — S300XXA Contusion of lower back and pelvis, initial encounter: Secondary | ICD-10-CM | POA: Diagnosis not present

## 2016-10-16 DIAGNOSIS — Y92410 Unspecified street and highway as the place of occurrence of the external cause: Secondary | ICD-10-CM | POA: Insufficient documentation

## 2016-10-16 DIAGNOSIS — S199XXA Unspecified injury of neck, initial encounter: Secondary | ICD-10-CM | POA: Diagnosis present

## 2016-10-16 LAB — I-STAT BETA HCG BLOOD, ED (MC, WL, AP ONLY): I-stat hCG, quantitative: <5 m[IU]/mL (ref <5)

## 2016-10-16 MED ORDER — IBUPROFEN 100 MG/5ML PO SUSP
ORAL | Status: AC
  Filled 2016-10-16: qty 20

## 2016-10-16 MED ORDER — HYDROCODONE-ACETAMINOPHEN 5-325 MG PO TABS
2.0000 | ORAL_TABLET | Freq: Once | ORAL | Status: AC
  Administered 2016-10-16: 2 via ORAL
  Filled 2016-10-16: qty 2

## 2016-10-16 MED ORDER — IBUPROFEN 100 MG/5ML PO SUSP
400.0000 mg | Freq: Once | ORAL | Status: AC
  Administered 2016-10-16: 400 mg via ORAL

## 2016-10-16 NOTE — ED Notes (Signed)
[employer] delivered pocket book and baby bag to room. Pt has 794 month old son in baby carrier bedside her bed.

## 2016-10-16 NOTE — ED Notes (Signed)
Pt's 874 month old son taken over to peds by uncle per mothers request.
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
## 2016-10-16 NOTE — ED Notes (Signed)
Pt helped onto bedpan.

## 2016-10-16 NOTE — ED Notes (Signed)
Patient currently in xray at this time 

## 2016-10-16 NOTE — ED Notes (Signed)
Pt transported to XRAY °

## 2016-10-16 NOTE — Discharge Instructions (Signed)
Take Tylenol or Advil as directed for pain. You can call any of the numbers on these instructions to get a primary care physician or see an urgent care center if having significant pain in 3 or 4 days. Return if concern for any reason

## 2016-10-16 NOTE — ED Notes (Signed)
Pt's mother called at work per patient request.

## 2016-10-16 NOTE — ED Notes (Signed)
Pt dressed in paper scrubs and ambulated pt tolerated well but did report left knee pain, no swelling or obvious injury present.

## 2016-12-01 ENCOUNTER — Emergency Department (HOSPITAL_COMMUNITY)
Admission: EM | Admit: 2016-12-01 | Discharge: 2016-12-01 | Disposition: A | Discharge status: Home / Self Care | Payer: Medicaid Other | Attending: Emergency Medicine | Admitting: Emergency Medicine

## 2016-12-01 ENCOUNTER — Encounter (HOSPITAL_COMMUNITY): Payer: Self-pay

## 2016-12-01 DIAGNOSIS — R350 Frequency of micturition: Secondary | ICD-10-CM | POA: Insufficient documentation

## 2016-12-01 DIAGNOSIS — R200 Anesthesia of skin: Secondary | ICD-10-CM | POA: Insufficient documentation

## 2016-12-01 DIAGNOSIS — N898 Other specified noninflammatory disorders of vagina: Secondary | ICD-10-CM | POA: Insufficient documentation

## 2016-12-01 DIAGNOSIS — R519 Headache, unspecified: Secondary | ICD-10-CM

## 2016-12-01 DIAGNOSIS — R110 Nausea: Secondary | ICD-10-CM | POA: Insufficient documentation

## 2016-12-01 DIAGNOSIS — Z113 Encounter for screening for infections with a predominantly sexual mode of transmission: Secondary | ICD-10-CM | POA: Insufficient documentation

## 2016-12-01 DIAGNOSIS — R51 Headache: Secondary | ICD-10-CM | POA: Insufficient documentation

## 2016-12-01 LAB — WET PREP, GENITAL
Clue Cells Wet Prep HPF POC: NONE SEEN
Sperm: NONE SEEN
Trich, Wet Prep: NONE SEEN
Yeast Wet Prep HPF POC: NONE SEEN

## 2016-12-01 LAB — URINALYSIS, ROUTINE W REFLEX MICROSCOPIC
Bilirubin Urine: NEGATIVE
Glucose, UA: NEGATIVE mg/dL
Hgb urine dipstick: NEGATIVE
Ketones, ur: NEGATIVE mg/dL
Leukocytes, UA: NEGATIVE
Nitrite: NEGATIVE
Protein, ur: NEGATIVE mg/dL
Specific Gravity, Urine: 1.023 (ref 1.005–1.030)
pH: 6 (ref 5.0–8.0)

## 2016-12-01 LAB — POC URINE PREG, ED: Preg Test, Ur: NEGATIVE

## 2016-12-01 MED ORDER — STERILE WATER FOR INJECTION IJ SOLN
INTRAMUSCULAR | Status: AC
  Administered 2016-12-01: 10 mL
  Filled 2016-12-01: qty 10

## 2016-12-01 MED ORDER — METOCLOPRAMIDE HCL 10 MG PO TABS
10.0000 mg | ORAL_TABLET | Freq: Once | ORAL | Status: AC
  Administered 2016-12-01: 10 mg via ORAL
  Filled 2016-12-01: qty 1

## 2016-12-01 MED ORDER — CEFTRIAXONE SODIUM 250 MG IJ SOLR
250.0000 mg | Freq: Once | INTRAMUSCULAR | Status: AC
  Administered 2016-12-01: 250 mg via INTRAMUSCULAR
  Filled 2016-12-01: qty 250

## 2016-12-01 MED ORDER — KETOROLAC TROMETHAMINE 15 MG/ML IJ SOLN
15.0000 mg | Freq: Once | INTRAMUSCULAR | Status: AC
  Administered 2016-12-01: 15 mg via INTRAVENOUS
  Filled 2016-12-01: qty 1

## 2016-12-01 MED ORDER — DIPHENHYDRAMINE HCL 25 MG PO CAPS
25.0000 mg | ORAL_CAPSULE | Freq: Once | ORAL | Status: AC
  Administered 2016-12-01: 25 mg via ORAL
  Filled 2016-12-01: qty 1

## 2016-12-01 MED ORDER — AZITHROMYCIN 250 MG PO TABS
1000.0000 mg | ORAL_TABLET | Freq: Once | ORAL | Status: AC
  Administered 2016-12-01: 1000 mg via ORAL
  Filled 2016-12-01: qty 4

## 2016-12-01 NOTE — ED Notes (Signed)
Pt is aware a urine sample is needed. 
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
## 2016-12-01 NOTE — Discharge Instructions (Signed)
Please read attached information. If you experience any new or worsening signs or symptoms please return to the emergency room for evaluation. Please follow-up with your primary care provider or specialist as discussed. Please use tylenol or ibuprofen as needed for symptoms. Please review all result on Mychart and inform any sexual partners of any relevant results.
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
## 2016-12-03 LAB — GC/CHLAMYDIA PROBE AMP (~~LOC~~) NOT AT ARMC
Chlamydia: NEGATIVE
Neisseria Gonorrhea: NEGATIVE

## 2016-12-08 NOTE — ED Triage Notes (Signed)
Pt called with questions regarding meds given last week. Chart opened to review.

## 2016-12-27 ENCOUNTER — Emergency Department (HOSPITAL_COMMUNITY)
Admission: EM | Admit: 2016-12-27 | Discharge: 2016-12-27 | Disposition: A | Discharge status: Home / Self Care | Payer: Self-pay | Attending: Emergency Medicine | Admitting: Emergency Medicine

## 2016-12-27 ENCOUNTER — Encounter (HOSPITAL_COMMUNITY): Payer: Self-pay | Admitting: Emergency Medicine

## 2016-12-27 ENCOUNTER — Emergency Department (HOSPITAL_COMMUNITY): Payer: Self-pay

## 2016-12-27 DIAGNOSIS — M545 Low back pain, unspecified: Secondary | ICD-10-CM

## 2016-12-27 DIAGNOSIS — R319 Hematuria, unspecified: Secondary | ICD-10-CM | POA: Insufficient documentation

## 2016-12-27 DIAGNOSIS — N926 Irregular menstruation, unspecified: Secondary | ICD-10-CM | POA: Insufficient documentation

## 2016-12-27 LAB — URINALYSIS, ROUTINE W REFLEX MICROSCOPIC
Bilirubin Urine: NEGATIVE
Glucose, UA: NEGATIVE mg/dL
KETONES UR: NEGATIVE mg/dL
Nitrite: NEGATIVE
PROTEIN: NEGATIVE mg/dL
Specific Gravity, Urine: 1.029 (ref 1.005–1.030)
pH: 7 (ref 5.0–8.0)

## 2016-12-27 LAB — POC URINE PREG, ED: Preg Test, Ur: NEGATIVE

## 2016-12-27 MED ORDER — IBUPROFEN 600 MG PO TABS: 600.0000 mg | ORAL_TABLET | Freq: Four times a day (QID) | ORAL | 0 refills | Status: DC | PRN

## 2016-12-27 NOTE — ED Triage Notes (Signed)
Patient here with complaints lower back pain. Does not know if blood is coming from vagina or if the blood is in urine. States that blood is on tissue when wiping. States that she is currently not on her period.
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
## 2017-03-08 ENCOUNTER — Emergency Department (HOSPITAL_COMMUNITY)
Admission: EM | Admit: 2017-03-08 | Discharge: 2017-03-08 | Disposition: A | Discharge status: Home / Self Care | Payer: No Typology Code available for payment source | Attending: Emergency Medicine | Admitting: Emergency Medicine

## 2017-03-08 ENCOUNTER — Encounter (HOSPITAL_COMMUNITY): Payer: Self-pay | Admitting: *Deleted

## 2017-03-08 ENCOUNTER — Emergency Department (HOSPITAL_COMMUNITY): Payer: No Typology Code available for payment source

## 2017-03-08 DIAGNOSIS — Y9389 Activity, other specified: Secondary | ICD-10-CM | POA: Insufficient documentation

## 2017-03-08 DIAGNOSIS — Y929 Unspecified place or not applicable: Secondary | ICD-10-CM | POA: Diagnosis not present

## 2017-03-08 DIAGNOSIS — Z7982 Long term (current) use of aspirin: Secondary | ICD-10-CM | POA: Diagnosis not present

## 2017-03-08 DIAGNOSIS — S161XXA Strain of muscle, fascia and tendon at neck level, initial encounter: Secondary | ICD-10-CM | POA: Diagnosis not present

## 2017-03-08 DIAGNOSIS — Y999 Unspecified external cause status: Secondary | ICD-10-CM | POA: Insufficient documentation

## 2017-03-08 DIAGNOSIS — S29019A Strain of muscle and tendon of unspecified wall of thorax, initial encounter: Secondary | ICD-10-CM | POA: Insufficient documentation

## 2017-03-08 DIAGNOSIS — S46912A Strain of unspecified muscle, fascia and tendon at shoulder and upper arm level, left arm, initial encounter: Secondary | ICD-10-CM | POA: Diagnosis not present

## 2017-03-08 DIAGNOSIS — S199XXA Unspecified injury of neck, initial encounter: Secondary | ICD-10-CM | POA: Diagnosis present

## 2017-03-08 DIAGNOSIS — Z79899 Other long term (current) drug therapy: Secondary | ICD-10-CM | POA: Diagnosis not present

## 2017-03-08 DIAGNOSIS — R51 Headache: Secondary | ICD-10-CM | POA: Diagnosis not present

## 2017-03-08 LAB — I-STAT BETA HCG BLOOD, ED (MC, WL, AP ONLY): I-stat hCG, quantitative: <5 m[IU]/mL (ref <5)

## 2017-03-08 MED ORDER — TRAMADOL HCL 50 MG PO TABS
50.0000 mg | ORAL_TABLET | Freq: Once | ORAL | Status: AC
  Administered 2017-03-08: 50 mg via ORAL
  Filled 2017-03-08: qty 1

## 2017-03-08 MED ORDER — CYCLOBENZAPRINE HCL 10 MG PO TABS: 10.0000 mg | ORAL_TABLET | Freq: Three times a day (TID) | ORAL | 0 refills | Status: DC | PRN

## 2017-03-08 MED ORDER — NAPROXEN 500 MG PO TABS: 500.0000 mg | ORAL_TABLET | Freq: Two times a day (BID) | ORAL | 0 refills | Status: DC

## 2017-03-08 MED ORDER — OXYCODONE-ACETAMINOPHEN 5-325 MG PO TABS: 1.0000 | ORAL_TABLET | ORAL | 0 refills | Status: DC | PRN

## 2017-03-08 NOTE — Discharge Instructions (Signed)
Apply ice several times a day. Follow up with your orthopedic doctor - you will probably need to go back for more physical therapy.
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
## 2017-03-08 NOTE — ED Notes (Signed)
Bed: WTR6 Expected date:  Expected time:  Means of arrival:  Comments: 

## 2017-03-10 NOTE — ED Notes (Signed)
Opened chart to reprint work note. 

## 2017-03-14 ENCOUNTER — Emergency Department (HOSPITAL_COMMUNITY): Payer: No Typology Code available for payment source

## 2017-03-14 ENCOUNTER — Emergency Department (HOSPITAL_COMMUNITY)
Admission: EM | Admit: 2017-03-14 | Discharge: 2017-03-14 | Disposition: A | Discharge status: Home / Self Care | Payer: No Typology Code available for payment source | Attending: Emergency Medicine | Admitting: Emergency Medicine

## 2017-03-14 ENCOUNTER — Encounter (HOSPITAL_COMMUNITY): Payer: Self-pay | Admitting: Emergency Medicine

## 2017-03-14 DIAGNOSIS — R0789 Other chest pain: Secondary | ICD-10-CM | POA: Diagnosis not present

## 2017-03-14 DIAGNOSIS — M25512 Pain in left shoulder: Secondary | ICD-10-CM | POA: Diagnosis present

## 2017-03-14 MED ORDER — KETOROLAC TROMETHAMINE 60 MG/2ML IM SOLN
60.0000 mg | Freq: Once | INTRAMUSCULAR | Status: AC
  Administered 2017-03-14: 60 mg via INTRAMUSCULAR
  Filled 2017-03-14: qty 2

## 2017-03-14 MED ORDER — NAPROXEN 500 MG PO TABS: 500.0000 mg | ORAL_TABLET | Freq: Two times a day (BID) | ORAL | 0 refills | Status: DC

## 2017-03-14 MED ORDER — CYCLOBENZAPRINE HCL 10 MG PO TABS: 10.0000 mg | ORAL_TABLET | Freq: Three times a day (TID) | ORAL | 0 refills | Status: DC | PRN

## 2017-03-14 MED ORDER — OXYCODONE-ACETAMINOPHEN 5-325 MG PO TABS: 1.0000 | ORAL_TABLET | ORAL | 0 refills | Status: DC | PRN
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
## 2017-03-14 NOTE — ED Provider Notes (Signed)
Ct chest shows no fractures.  Pt advised to follow up with Orthopaedist for evaluation.  Pt given refill of medications.   An After Visit Summary was printed and given to the patient.    Elson AreasSofia, Leslie K, PA-C 03/14/17 1717    Bethann BerkshireZammit, Joseph, MD 03/15/17 [phone]
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
## 2017-03-14 NOTE — ED Notes (Signed)
Ortho Provider at bedside. 
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
## 2017-05-29 ENCOUNTER — Emergency Department (HOSPITAL_COMMUNITY): Payer: Medicaid Other

## 2017-05-29 ENCOUNTER — Emergency Department (HOSPITAL_COMMUNITY)
Admission: EM | Admit: 2017-05-29 | Discharge: 2017-05-29 | Disposition: A | Discharge status: Home / Self Care | Payer: Medicaid Other | Attending: Emergency Medicine | Admitting: Emergency Medicine

## 2017-05-29 ENCOUNTER — Encounter (HOSPITAL_COMMUNITY): Payer: Self-pay | Admitting: Nurse Practitioner

## 2017-05-29 DIAGNOSIS — J069 Acute upper respiratory infection, unspecified: Secondary | ICD-10-CM | POA: Insufficient documentation

## 2017-05-29 DIAGNOSIS — R079 Chest pain, unspecified: Secondary | ICD-10-CM | POA: Diagnosis present

## 2017-05-29 LAB — CBC
HCT: 41.6 % (ref 36.0–46.0)
Hemoglobin: 13.9 g/dL (ref 12.0–15.0)
MCH: 30.5 pg (ref 26.0–34.0)
MCHC: 33.4 g/dL (ref 30.0–36.0)
MCV: 91.2 fL (ref 78.0–100.0)
PLATELETS: 236 10*3/uL (ref 150–400)
RBC: 4.56 MIL/uL (ref 3.87–5.11)
RDW: 12.8 % (ref 11.5–15.5)
WBC: 11.8 10*3/uL — ABNORMAL HIGH (ref 4.0–10.5)

## 2017-05-29 LAB — I-STAT TROPONIN, ED: Troponin i, poc: 0 ng/mL (ref 0.00–0.08)

## 2017-05-29 LAB — BASIC METABOLIC PANEL
Anion gap: 9 (ref 5–15)
BUN: 14 mg/dL (ref 6–20)
CHLORIDE: 112 mmol/L — AB (ref 101–111)
CO2: 21 mmol/L — AB (ref 22–32)
CREATININE: 0.71 mg/dL (ref 0.44–1.00)
Calcium: 9.2 mg/dL (ref 8.9–10.3)
GFR calc Af Amer: >60 mL/min (ref >60)
GFR calc non Af Amer: >60 mL/min (ref >60)
Glucose, Bld: 119 mg/dL — ABNORMAL HIGH (ref 65–99)
Potassium: 3.9 mmol/L (ref 3.5–5.1)
Sodium: 142 mmol/L (ref 135–145)

## 2017-05-29 LAB — I-STAT BETA HCG BLOOD, ED (MC, WL, AP ONLY): I-stat hCG, quantitative: <5 m[IU]/mL (ref <5)

## 2017-05-29 MED ORDER — BENZONATATE 100 MG PO CAPS: 100.0000 mg | ORAL_CAPSULE | Freq: Three times a day (TID) | ORAL | 0 refills | Status: DC

## 2017-05-29 NOTE — ED Triage Notes (Signed)
Pt endorses productive cough, nausea, chest pain worse with deep inspiration and back pain. Pt sts all symptoms came on yesterday.

## 2017-05-29 NOTE — Discharge Instructions (Signed)
Please read attached information. If you experience any new or worsening signs or symptoms please return to the emergency room for evaluation. Please follow-up with your primary care provider or specialist as discussed. Please use medication prescribed only as directed and discontinue taking if you have any concerning signs or symptoms.   °

## 2017-05-29 NOTE — ED Notes (Signed)
Lab work, radiology results and vital signs reviewed, no critical results at this time, no change in acuity indicated.  
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
## 2017-05-29 NOTE — ED Notes (Signed)
Very angry  She repolrts that she has been here since 1000 am today and she still has the same pain

## 2017-07-10 ENCOUNTER — Encounter (HOSPITAL_COMMUNITY): Payer: Self-pay | Admitting: Emergency Medicine

## 2017-07-10 ENCOUNTER — Emergency Department (HOSPITAL_COMMUNITY): Payer: Self-pay

## 2017-07-10 ENCOUNTER — Other Ambulatory Visit: Payer: Self-pay

## 2017-07-10 ENCOUNTER — Emergency Department (HOSPITAL_COMMUNITY)
Admission: EM | Admit: 2017-07-10 | Discharge: 2017-07-10 | Disposition: A | Discharge status: Home / Self Care | Payer: Self-pay | Attending: Emergency Medicine | Admitting: Emergency Medicine

## 2017-07-10 DIAGNOSIS — R0789 Other chest pain: Secondary | ICD-10-CM | POA: Insufficient documentation

## 2017-07-10 DIAGNOSIS — R0981 Nasal congestion: Secondary | ICD-10-CM | POA: Insufficient documentation

## 2017-07-10 DIAGNOSIS — R6889 Other general symptoms and signs: Secondary | ICD-10-CM

## 2017-07-10 DIAGNOSIS — R05 Cough: Secondary | ICD-10-CM | POA: Insufficient documentation

## 2017-07-10 DIAGNOSIS — R509 Fever, unspecified: Secondary | ICD-10-CM | POA: Insufficient documentation

## 2017-07-10 DIAGNOSIS — R112 Nausea with vomiting, unspecified: Secondary | ICD-10-CM | POA: Insufficient documentation

## 2017-07-10 DIAGNOSIS — R51 Headache: Secondary | ICD-10-CM | POA: Insufficient documentation

## 2017-07-10 DIAGNOSIS — J029 Acute pharyngitis, unspecified: Secondary | ICD-10-CM | POA: Insufficient documentation

## 2017-07-10 LAB — RAPID STREP SCREEN (MED CTR MEBANE ONLY): STREPTOCOCCUS, GROUP A SCREEN (DIRECT): NEGATIVE

## 2017-07-10 MED ORDER — ONDANSETRON HCL 4 MG PO TABS
4.0000 mg | ORAL_TABLET | Freq: Once | ORAL | Status: AC
  Administered 2017-07-10: 4 mg via ORAL
  Filled 2017-07-10: qty 1

## 2017-07-10 MED ORDER — PROMETHAZINE-DM 6.25-15 MG/5ML PO SYRP: 5.0000 mL | ORAL_SOLUTION | Freq: Four times a day (QID) | ORAL | 0 refills | Status: DC | PRN

## 2017-07-10 MED ORDER — FLUTICASONE PROPIONATE 50 MCG/ACT NA SUSP: 2.0000 | Freq: Every day | NASAL | 0 refills | Status: DC

## 2017-07-10 MED ORDER — ACETAMINOPHEN 325 MG PO TABS
650.0000 mg | ORAL_TABLET | Freq: Once | ORAL | Status: AC | PRN
  Administered 2017-07-10: 650 mg via ORAL
  Filled 2017-07-10: qty 2

## 2017-07-10 MED ORDER — ONDANSETRON HCL 4 MG PO TABS: 4.0000 mg | ORAL_TABLET | Freq: Three times a day (TID) | ORAL | 0 refills | Status: DC | PRN
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
## 2017-07-10 NOTE — ED Triage Notes (Signed)
Pt reports headache x 4 days, cough, intermittent nausea. Child #2  tested positive for flu. Child #1 had fever of 104.0 this am. Pt treated self with Theraflu and it caused her to vomit-yesterday. Pt currently c/o occasional cough and new onset chest chest pain. Pt drove self to hospital.

## 2017-07-10 NOTE — Discharge Instructions (Signed)
You likely have a viral illness.  This should be treated symptomatically. Use Tylenol or ibuprofen as needed for fevers or body aches. Use Flonase daily for nasal congestion and cough. Use cough syrup as needed.  Take zofran as needed for nausea or vomiting.  Make sure you stay well-hydrated with water. Stay out of work/public area until you are fever free for 24 hours.  Wash your hands frequently to prevent spread of infection. Return to the emergency room if you develop chest pain, difficulty breathing, or any new or worsening symptoms.

## 2017-07-12 LAB — CULTURE, GROUP A STREP (THRC)

## 2017-08-18 ENCOUNTER — Other Ambulatory Visit: Payer: Self-pay

## 2017-08-18 ENCOUNTER — Emergency Department (HOSPITAL_COMMUNITY)
Admission: EM | Admit: 2017-08-18 | Discharge: 2017-08-18 | Disposition: A | Discharge status: Home / Self Care | Payer: BLUE CROSS/BLUE SHIELD | Attending: Emergency Medicine | Admitting: Emergency Medicine

## 2017-08-18 ENCOUNTER — Emergency Department (HOSPITAL_COMMUNITY): Payer: BLUE CROSS/BLUE SHIELD

## 2017-08-18 ENCOUNTER — Encounter (HOSPITAL_COMMUNITY): Payer: Self-pay

## 2017-08-18 DIAGNOSIS — R1110 Vomiting, unspecified: Secondary | ICD-10-CM | POA: Diagnosis present

## 2017-08-18 DIAGNOSIS — Z79899 Other long term (current) drug therapy: Secondary | ICD-10-CM | POA: Diagnosis not present

## 2017-08-18 DIAGNOSIS — N760 Acute vaginitis: Secondary | ICD-10-CM | POA: Insufficient documentation

## 2017-08-18 LAB — CBC WITH DIFFERENTIAL/PLATELET
BASOS ABS: 0 10*3/uL (ref 0.0–0.1)
BASOS PCT: 0 %
EOS ABS: 0.2 10*3/uL (ref 0.0–0.7)
EOS PCT: 2 %
HCT: 37.5 % (ref 36.0–46.0)
HEMOGLOBIN: 12.5 g/dL (ref 12.0–15.0)
LYMPHS ABS: 2.8 10*3/uL (ref 0.7–4.0)
Lymphocytes Relative: 30 %
MCH: 30.6 pg (ref 26.0–34.0)
MCHC: 33.3 g/dL (ref 30.0–36.0)
MCV: 91.7 fL (ref 78.0–100.0)
Monocytes Absolute: 0.6 10*3/uL (ref 0.1–1.0)
Monocytes Relative: 7 %
NEUTROS PCT: 61 %
Neutro Abs: 5.7 10*3/uL (ref 1.7–7.7)
PLATELETS: 306 10*3/uL (ref 150–400)
RBC: 4.09 MIL/uL (ref 3.87–5.11)
RDW: 13.3 % (ref 11.5–15.5)
WBC: 9.4 10*3/uL (ref 4.0–10.5)

## 2017-08-18 LAB — COMPREHENSIVE METABOLIC PANEL
ALBUMIN: 3.8 g/dL (ref 3.5–5.0)
ALK PHOS: 73 U/L (ref 38–126)
ALT: 16 U/L (ref 14–54)
AST: 21 U/L (ref 15–41)
Anion gap: 8 (ref 5–15)
BUN: 15 mg/dL (ref 6–20)
CALCIUM: 9.4 mg/dL (ref 8.9–10.3)
CHLORIDE: 104 mmol/L (ref 101–111)
CO2: 26 mmol/L (ref 22–32)
CREATININE: 0.8 mg/dL (ref 0.44–1.00)
GFR calc non Af Amer: >60 mL/min (ref >60)
GLUCOSE: 90 mg/dL (ref 65–99)
Potassium: 5.1 mmol/L (ref 3.5–5.1)
SODIUM: 138 mmol/L (ref 135–145)
Total Bilirubin: 0.6 mg/dL (ref 0.3–1.2)
Total Protein: 7.1 g/dL (ref 6.5–8.1)

## 2017-08-18 LAB — WET PREP, GENITAL
SPERM: NONE SEEN
Trich, Wet Prep: NONE SEEN
YEAST WET PREP: NONE SEEN

## 2017-08-18 LAB — URINALYSIS, ROUTINE W REFLEX MICROSCOPIC
BILIRUBIN URINE: NEGATIVE
Bacteria, UA: NONE SEEN
GLUCOSE, UA: NEGATIVE mg/dL
Hgb urine dipstick: NEGATIVE
Ketones, ur: NEGATIVE mg/dL
Nitrite: NEGATIVE
PH: 6 (ref 5.0–8.0)
Protein, ur: NEGATIVE mg/dL
Specific Gravity, Urine: 1.021 (ref 1.005–1.030)

## 2017-08-18 LAB — POC URINE PREG, ED: Preg Test, Ur: NEGATIVE

## 2017-08-18 MED ORDER — LIDOCAINE HCL 1 % IJ SOLN
INTRAMUSCULAR | Status: AC
  Administered 2017-08-18: 1.2 mL
  Filled 2017-08-18: qty 20

## 2017-08-18 MED ORDER — DIPHENHYDRAMINE HCL 50 MG/ML IJ SOLN
25.0000 mg | Freq: Once | INTRAMUSCULAR | Status: AC
  Administered 2017-08-18: 25 mg via INTRAVENOUS
  Filled 2017-08-18: qty 1

## 2017-08-18 MED ORDER — DOXYCYCLINE HYCLATE 100 MG PO TABS
100.0000 mg | ORAL_TABLET | Freq: Once | ORAL | Status: AC
  Administered 2017-08-18: 100 mg via ORAL
  Filled 2017-08-18: qty 1

## 2017-08-18 MED ORDER — CEFTRIAXONE SODIUM 250 MG IJ SOLR
250.0000 mg | Freq: Once | INTRAMUSCULAR | Status: AC
  Administered 2017-08-18: 250 mg via INTRAMUSCULAR
  Filled 2017-08-18: qty 250

## 2017-08-18 MED ORDER — KETOROLAC TROMETHAMINE 15 MG/ML IJ SOLN
15.0000 mg | Freq: Once | INTRAMUSCULAR | Status: AC
  Administered 2017-08-18: 15 mg via INTRAVENOUS
  Filled 2017-08-18: qty 1

## 2017-08-18 MED ORDER — SODIUM CHLORIDE 0.9 % IV BOLUS
1000.0000 mL | Freq: Once | INTRAVENOUS | Status: AC
  Administered 2017-08-18: 1000 mL via INTRAVENOUS

## 2017-08-18 MED ORDER — METOCLOPRAMIDE HCL 5 MG/ML IJ SOLN
10.0000 mg | Freq: Once | INTRAMUSCULAR | Status: AC
  Administered 2017-08-18: 10 mg via INTRAVENOUS
  Filled 2017-08-18: qty 2

## 2017-08-18 MED ORDER — DOXYCYCLINE HYCLATE 100 MG PO CAPS
100.0000 mg | ORAL_CAPSULE | Freq: Two times a day (BID) | ORAL | 0 refills | Status: DC
Start: 2017-08-18 — End: 2018-05-15

## 2017-08-18 MED ORDER — ONDANSETRON HCL 4 MG/2ML IJ SOLN
4.0000 mg | Freq: Once | INTRAMUSCULAR | Status: AC
  Administered 2017-08-18: 4 mg via INTRAVENOUS
  Filled 2017-08-18: qty 2

## 2017-08-18 MED ORDER — ONDANSETRON HCL 4 MG PO TABS: 4.0000 mg | ORAL_TABLET | Freq: Four times a day (QID) | ORAL | 0 refills | Status: DC
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
## 2017-08-18 NOTE — ED Triage Notes (Signed)
She reports nausea with occasional vomiting for past two weeks. She is in no distress. She also reports a general feeling of "weakness sometimes, especially at work when I stand up".

## 2017-08-19 LAB — RPR: RPR Ser Ql: NONREACTIVE

## 2017-08-19 LAB — HIV ANTIBODY (ROUTINE TESTING W REFLEX): HIV Screen 4th Generation wRfx: NONREACTIVE

## 2017-08-19 LAB — GC/CHLAMYDIA PROBE AMP (~~LOC~~) NOT AT ARMC
Chlamydia: POSITIVE — AB
NEISSERIA GONORRHEA: NEGATIVE

## 2017-10-22 ENCOUNTER — Emergency Department (HOSPITAL_COMMUNITY)
Admission: EM | Admit: 2017-10-22 | Discharge: 2017-10-23 | Disposition: A | Discharge status: Home / Self Care | Payer: BLUE CROSS/BLUE SHIELD | Attending: Emergency Medicine | Admitting: Emergency Medicine

## 2017-10-22 ENCOUNTER — Encounter (HOSPITAL_COMMUNITY): Payer: Self-pay

## 2017-10-22 ENCOUNTER — Other Ambulatory Visit: Payer: Self-pay

## 2017-10-22 DIAGNOSIS — Z79899 Other long term (current) drug therapy: Secondary | ICD-10-CM | POA: Diagnosis not present

## 2017-10-22 DIAGNOSIS — J111 Influenza due to unidentified influenza virus with other respiratory manifestations: Secondary | ICD-10-CM | POA: Diagnosis not present

## 2017-10-22 DIAGNOSIS — R519 Headache, unspecified: Secondary | ICD-10-CM

## 2017-10-22 DIAGNOSIS — R69 Illness, unspecified: Secondary | ICD-10-CM

## 2017-10-22 DIAGNOSIS — M545 Low back pain, unspecified: Secondary | ICD-10-CM

## 2017-10-22 DIAGNOSIS — R509 Fever, unspecified: Secondary | ICD-10-CM | POA: Insufficient documentation

## 2017-10-22 DIAGNOSIS — R51 Headache: Secondary | ICD-10-CM | POA: Insufficient documentation

## 2017-10-22 LAB — CBC WITH DIFFERENTIAL/PLATELET
Abs Immature Granulocytes: 0.1 10*3/uL (ref 0.0–0.1)
Basophils Absolute: 0.1 10*3/uL (ref 0.0–0.1)
Basophils Relative: 0 %
Eosinophils Absolute: 0 10*3/uL (ref 0.0–0.7)
Eosinophils Relative: 0 %
HCT: 43.5 % (ref 36.0–46.0)
Hemoglobin: 14.7 g/dL (ref 12.0–15.0)
Immature Granulocytes: 0 %
Lymphocytes Relative: 8 %
Lymphs Abs: 1 10*3/uL (ref 0.7–4.0)
MCH: 30.6 pg (ref 26.0–34.0)
MCHC: 33.8 g/dL (ref 30.0–36.0)
MCV: 90.4 fL (ref 78.0–100.0)
Monocytes Absolute: 0.9 10*3/uL (ref 0.1–1.0)
Monocytes Relative: 7 %
Neutro Abs: 11.5 10*3/uL — ABNORMAL HIGH (ref 1.7–7.7)
Neutrophils Relative %: 85 %
Platelets: 221 10*3/uL (ref 150–400)
RBC: 4.81 MIL/uL (ref 3.87–5.11)
RDW: 12.7 % (ref 11.5–15.5)
WBC: 13.6 10*3/uL — ABNORMAL HIGH (ref 4.0–10.5)

## 2017-10-22 LAB — I-STAT CG4 LACTIC ACID, ED
LACTIC ACID, VENOUS: 2.73 mmol/L — AB (ref 0.5–1.9)
Lactic Acid, Venous: 1.09 mmol/L (ref 0.5–1.9)

## 2017-10-22 LAB — I-STAT BETA HCG BLOOD, ED (MC, WL, AP ONLY): HCG, QUANTITATIVE: 10.5 m[IU]/mL — AB (ref <5)

## 2017-10-22 LAB — COMPREHENSIVE METABOLIC PANEL
ALT: 14 U/L (ref 14–54)
ANION GAP: 15 (ref 5–15)
AST: 26 U/L (ref 15–41)
Albumin: 4.3 g/dL (ref 3.5–5.0)
Alkaline Phosphatase: 60 U/L (ref 38–126)
BILIRUBIN TOTAL: 1.3 mg/dL — AB (ref 0.3–1.2)
BUN: 7 mg/dL (ref 6–20)
CO2: 18 mmol/L — ABNORMAL LOW (ref 22–32)
Calcium: 9.9 mg/dL (ref 8.9–10.3)
Chloride: 105 mmol/L (ref 101–111)
Creatinine, Ser: 0.9 mg/dL (ref 0.44–1.00)
GFR calc Af Amer: >60 mL/min (ref >60)
Glucose, Bld: 85 mg/dL (ref 65–99)
POTASSIUM: 3.6 mmol/L (ref 3.5–5.1)
Sodium: 138 mmol/L (ref 135–145)
TOTAL PROTEIN: 7.6 g/dL (ref 6.5–8.1)

## 2017-10-22 MED ORDER — KETOROLAC TROMETHAMINE 30 MG/ML IJ SOLN
30.0000 mg | Freq: Once | INTRAMUSCULAR | Status: AC
  Administered 2017-10-22: 30 mg via INTRAVENOUS
  Filled 2017-10-22: qty 1

## 2017-10-22 MED ORDER — DIPHENHYDRAMINE HCL 50 MG/ML IJ SOLN
12.5000 mg | Freq: Once | INTRAMUSCULAR | Status: AC
  Administered 2017-10-22: 12.5 mg via INTRAVENOUS
  Filled 2017-10-22: qty 1

## 2017-10-22 MED ORDER — SODIUM CHLORIDE 0.9 % IV BOLUS: 1000.0000 mL | Freq: Once | INTRAVENOUS | Status: DC

## 2017-10-22 MED ORDER — SODIUM CHLORIDE 0.9 % IV BOLUS
2000.0000 mL | Freq: Once | INTRAVENOUS | Status: AC
  Administered 2017-10-22: 2000 mL via INTRAVENOUS

## 2017-10-22 MED ORDER — ONDANSETRON HCL 4 MG/2ML IJ SOLN
4.0000 mg | Freq: Once | INTRAMUSCULAR | Status: AC
  Administered 2017-10-22: 4 mg via INTRAVENOUS
  Filled 2017-10-22: qty 2

## 2017-10-22 MED ORDER — ACETAMINOPHEN 325 MG PO TABS
650.0000 mg | ORAL_TABLET | Freq: Once | ORAL | Status: AC | PRN
  Administered 2017-10-22: 650 mg via ORAL
  Filled 2017-10-22: qty 2

## 2017-10-22 NOTE — ED Notes (Signed)
Pt refused vital sign recheck. Pt stated "Just wait until I'm in a room"

## 2017-10-22 NOTE — ED Notes (Addendum)
Pt removed monitor, no s&s distress.  Advised PA will see her soon, apologized for wait.  Reminded urine sample is needed

## 2017-10-22 NOTE — ED Notes (Signed)
Brook-RN@[email or web address] , Chad-CN, and Dr. Charm BargesButler notified of elevated CG-4
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
## 2017-10-22 NOTE — ED Triage Notes (Signed)
Pt endorses sore throat headache and extreme lower back pain that started last night. No hx of back problems. Pt took 2 tylenol without relief. [facility] and states that she [facility] while walking over here. Did not hit head and no loc. Pt tearful and very anxious in triage. Afebrile.

## 2017-10-22 NOTE — ED Notes (Signed)
Pt crying and screaming while sitting in chair in hallway. This RN reassessed pt, pt states "I don't want to talk to you unless you are putting me in a room I've been here since 12 oclock"

## 2017-10-23 ENCOUNTER — Emergency Department (HOSPITAL_COMMUNITY): Payer: BLUE CROSS/BLUE SHIELD

## 2017-10-23 LAB — URINALYSIS, ROUTINE W REFLEX MICROSCOPIC
Bacteria, UA: NONE SEEN
Bilirubin Urine: NEGATIVE
GLUCOSE, UA: NEGATIVE mg/dL
Ketones, ur: 20 mg/dL — AB
Leukocytes, UA: NEGATIVE
Nitrite: NEGATIVE
Protein, ur: NEGATIVE mg/dL
SPECIFIC GRAVITY, URINE: 1.024 (ref 1.005–1.030)
pH: 6 (ref 5.0–8.0)

## 2017-10-23 LAB — GROUP A STREP BY PCR: Group A Strep by PCR: NEGATIVE — AB

## 2017-10-23 LAB — HCG, QUANTITATIVE, PREGNANCY: hCG, Beta Chain, Quant, S: <1 m[IU]/mL (ref <5)

## 2017-10-23 LAB — PREGNANCY, URINE: Preg Test, Ur: NEGATIVE

## 2017-10-23 MED ORDER — ONDANSETRON 4 MG PO TBDP: ORAL_TABLET | ORAL | 0 refills | Status: DC

## 2017-10-23 NOTE — Discharge Instructions (Addendum)
1. Medications: zofran for nausea and vomiting, ibuprofen and tylenol for fever, usual home medications 2. Treatment: rest, drink plenty of fluids,  3. Follow Up: Please followup with your primary doctor in 1-2 days for discussion of your diagnoses and further evaluation after today's visit; if you do not have a primary care doctor use the resource guide provided to find one; Please return to the ER for worsening symptoms, fevers that do not decrease, persistent vomiting, weakness in your legs or inability to walk or any other concerns

## 2017-10-27 LAB — CULTURE, BLOOD (ROUTINE X 2)
CULTURE: NO GROWTH
Culture: NO GROWTH

## 2017-11-22 ENCOUNTER — Encounter (HOSPITAL_COMMUNITY): Payer: Self-pay | Admitting: Emergency Medicine

## 2017-11-22 ENCOUNTER — Emergency Department (HOSPITAL_COMMUNITY)
Admission: EM | Admit: 2017-11-22 | Discharge: 2017-11-22 | Disposition: A | Discharge status: Home / Self Care | Payer: BLUE CROSS/BLUE SHIELD | Attending: Emergency Medicine | Admitting: Emergency Medicine

## 2017-11-22 DIAGNOSIS — N760 Acute vaginitis: Secondary | ICD-10-CM | POA: Insufficient documentation

## 2017-11-22 DIAGNOSIS — B9689 Other specified bacterial agents as the cause of diseases classified elsewhere: Secondary | ICD-10-CM

## 2017-11-22 DIAGNOSIS — Z202 Contact with and (suspected) exposure to infections with a predominantly sexual mode of transmission: Secondary | ICD-10-CM | POA: Diagnosis present

## 2017-11-22 LAB — URINALYSIS, ROUTINE W REFLEX MICROSCOPIC
BACTERIA UA: NONE SEEN
Bilirubin Urine: NEGATIVE
Glucose, UA: NEGATIVE mg/dL
Ketones, ur: NEGATIVE mg/dL
Leukocytes, UA: NEGATIVE
Nitrite: NEGATIVE
PH: 6 (ref 5.0–8.0)
Protein, ur: NEGATIVE mg/dL
SPECIFIC GRAVITY, URINE: 1.012 (ref 1.005–1.030)

## 2017-11-22 LAB — WET PREP, GENITAL
SPERM: NONE SEEN
TRICH WET PREP: NONE SEEN
Yeast Wet Prep HPF POC: NONE SEEN

## 2017-11-22 LAB — POC URINE PREG, ED: Preg Test, Ur: NEGATIVE

## 2017-11-22 MED ORDER — METRONIDAZOLE 500 MG PO TABS: 500.0000 mg | ORAL_TABLET | Freq: Two times a day (BID) | ORAL | 0 refills | Status: DC

## 2017-11-22 MED ORDER — CEFTRIAXONE SODIUM 250 MG IJ SOLR
250.0000 mg | Freq: Once | INTRAMUSCULAR | Status: AC
  Administered 2017-11-22: 250 mg via INTRAMUSCULAR
  Filled 2017-11-22: qty 250

## 2017-11-22 MED ORDER — LIDOCAINE HCL 1 % IJ SOLN
INTRAMUSCULAR | Status: AC
  Administered 2017-11-22: 20 mL
  Filled 2017-11-22: qty 20

## 2017-11-22 NOTE — ED Triage Notes (Signed)
Patient reports boyfriend was diagnosed with gonorrhea x2 days ago. C/o  white vaginal discharge.
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
## 2017-11-22 NOTE — ED Notes (Signed)
PATIENT HAS KINDLY REQUESTED THAT WE DO NOT DO A PELVIC EXAM.

## 2017-11-25 LAB — GC/CHLAMYDIA PROBE AMP (~~LOC~~) NOT AT ARMC
Chlamydia: NEGATIVE
Neisseria Gonorrhea: POSITIVE — AB

## 2018-01-03 ENCOUNTER — Emergency Department (HOSPITAL_COMMUNITY)
Admission: EM | Admit: 2018-01-03 | Discharge: 2018-01-03 | Disposition: A | Discharge status: Home / Self Care | Payer: BLUE CROSS/BLUE SHIELD | Attending: Emergency Medicine | Admitting: Emergency Medicine

## 2018-01-03 ENCOUNTER — Emergency Department (HOSPITAL_COMMUNITY): Payer: BLUE CROSS/BLUE SHIELD

## 2018-01-03 ENCOUNTER — Encounter (HOSPITAL_COMMUNITY): Payer: Self-pay | Admitting: *Deleted

## 2018-01-03 DIAGNOSIS — Y939 Activity, unspecified: Secondary | ICD-10-CM | POA: Insufficient documentation

## 2018-01-03 DIAGNOSIS — G44319 Acute post-traumatic headache, not intractable: Secondary | ICD-10-CM | POA: Insufficient documentation

## 2018-01-03 DIAGNOSIS — Z041 Encounter for examination and observation following transport accident: Secondary | ICD-10-CM | POA: Diagnosis present

## 2018-01-03 DIAGNOSIS — Y999 Unspecified external cause status: Secondary | ICD-10-CM | POA: Insufficient documentation

## 2018-01-03 DIAGNOSIS — S39012A Strain of muscle, fascia and tendon of lower back, initial encounter: Secondary | ICD-10-CM | POA: Insufficient documentation

## 2018-01-03 DIAGNOSIS — Y929 Unspecified place or not applicable: Secondary | ICD-10-CM | POA: Diagnosis not present

## 2018-01-03 DIAGNOSIS — T148XXA Other injury of unspecified body region, initial encounter: Secondary | ICD-10-CM

## 2018-01-03 MED ORDER — IBUPROFEN 800 MG PO TABS
800.0000 mg | ORAL_TABLET | Freq: Once | ORAL | Status: DC
  Filled 2018-01-03: qty 1
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
## 2018-01-03 NOTE — Discharge Instructions (Signed)
Take ibuprofen 3 times a day with meals OR take aleve 2 times a day. You may supplement with Tylenol if you need further pain control. Use ice packs or heating pads if this helps control your pain. Use muscle creams, salonpas, icy hot, or bengay, to help with your pain. You will likely have continued muscle stiffness and soreness over the next couple days.  Follow-up with primary care in 1 week if your symptoms are not improving. Return to the emergency room if you develop vision changes, vomiting, slurred speech, numbness, loss of bowel or bladder control, or any new or worsening symptoms.
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
## 2018-05-15 ENCOUNTER — Other Ambulatory Visit: Payer: Self-pay

## 2018-05-15 ENCOUNTER — Encounter (HOSPITAL_COMMUNITY): Payer: Self-pay | Admitting: Emergency Medicine

## 2018-05-15 ENCOUNTER — Ambulatory Visit (HOSPITAL_COMMUNITY)
Admission: EM | Admit: 2018-05-15 | Discharge: 2018-05-15 | Disposition: A | Discharge status: Home / Self Care | Payer: BLUE CROSS/BLUE SHIELD | Attending: Family Medicine | Admitting: Family Medicine

## 2018-05-15 DIAGNOSIS — R350 Frequency of micturition: Secondary | ICD-10-CM

## 2018-05-15 DIAGNOSIS — N390 Urinary tract infection, site not specified: Secondary | ICD-10-CM | POA: Insufficient documentation

## 2018-05-15 DIAGNOSIS — Z833 Family history of diabetes mellitus: Secondary | ICD-10-CM | POA: Insufficient documentation

## 2018-05-15 DIAGNOSIS — Z888 Allergy status to other drugs, medicaments and biological substances status: Secondary | ICD-10-CM | POA: Insufficient documentation

## 2018-05-15 DIAGNOSIS — Z880 Allergy status to penicillin: Secondary | ICD-10-CM | POA: Insufficient documentation

## 2018-05-15 DIAGNOSIS — Z3202 Encounter for pregnancy test, result negative: Secondary | ICD-10-CM

## 2018-05-15 DIAGNOSIS — Z8249 Family history of ischemic heart disease and other diseases of the circulatory system: Secondary | ICD-10-CM | POA: Insufficient documentation

## 2018-05-15 LAB — POCT URINALYSIS DIP (DEVICE)
BILIRUBIN URINE: NEGATIVE
GLUCOSE, UA: NEGATIVE mg/dL
KETONES UR: NEGATIVE mg/dL
Nitrite: NEGATIVE
Protein, ur: 30 mg/dL — AB
Specific Gravity, Urine: 1.025 (ref 1.005–1.030)
Urobilinogen, UA: 0.2 mg/dL (ref 0.0–1.0)
pH: 6.5 (ref 5.0–8.0)

## 2018-05-15 LAB — POCT PREGNANCY, URINE: PREG TEST UR: NEGATIVE

## 2018-05-15 MED ORDER — NITROFURANTOIN MONOHYD MACRO 100 MG PO CAPS: 100.0000 mg | ORAL_CAPSULE | Freq: Two times a day (BID) | ORAL | 0 refills | Status: DC

## 2018-05-15 NOTE — Discharge Instructions (Addendum)
We are treating you for a urinary tract infection today I am sending the urine for culture and cytology We will call you with any positive results He will take the Macrobid twice a day for the next 5 days Make sure you are drinking plenty of water Follow up as needed for continued or worsening symptoms
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
## 2018-05-15 NOTE — ED Triage Notes (Signed)
Pt reports urinary frequency and discomfort with urination x2 days.

## 2018-05-16 LAB — URINE CULTURE: Culture: <10000 — AB

## 2018-05-16 LAB — URINE CYTOLOGY ANCILLARY ONLY
Chlamydia: NEGATIVE
Neisseria Gonorrhea: NEGATIVE
TRICH (WINDOWPATH): NEGATIVE

## 2018-05-19 LAB — URINE CYTOLOGY ANCILLARY ONLY
BACTERIAL VAGINITIS: POSITIVE — AB
Candida vaginitis: NEGATIVE

## 2018-05-20 ENCOUNTER — Encounter (HOSPITAL_COMMUNITY): Payer: Self-pay

## 2018-05-20 ENCOUNTER — Telehealth (HOSPITAL_COMMUNITY): Payer: Self-pay | Admitting: Emergency Medicine

## 2018-05-20 MED ORDER — METRONIDAZOLE 500 MG PO TABS: 500.0000 mg | ORAL_TABLET | Freq: Two times a day (BID) | ORAL | 0 refills | Status: DC

## 2018-05-20 NOTE — Telephone Encounter (Signed)
Patient called back, given results. All questions answered.

## 2018-07-15 ENCOUNTER — Encounter (HOSPITAL_COMMUNITY): Payer: Self-pay | Admitting: Emergency Medicine

## 2018-07-15 ENCOUNTER — Other Ambulatory Visit: Payer: Self-pay

## 2018-07-15 ENCOUNTER — Ambulatory Visit (HOSPITAL_COMMUNITY)
Admission: EM | Admit: 2018-07-15 | Discharge: 2018-07-15 | Disposition: A | Discharge status: Home / Self Care | Payer: Self-pay | Attending: Family Medicine | Admitting: Family Medicine

## 2018-07-15 DIAGNOSIS — R358 Other polyuria: Secondary | ICD-10-CM

## 2018-07-15 DIAGNOSIS — R350 Frequency of micturition: Secondary | ICD-10-CM

## 2018-07-15 DIAGNOSIS — Z202 Contact with and (suspected) exposure to infections with a predominantly sexual mode of transmission: Secondary | ICD-10-CM | POA: Diagnosis present

## 2018-07-15 DIAGNOSIS — Z113 Encounter for screening for infections with a predominantly sexual mode of transmission: Secondary | ICD-10-CM

## 2018-07-15 LAB — POCT URINALYSIS DIP (DEVICE)
Bilirubin Urine: NEGATIVE
Glucose, UA: NEGATIVE mg/dL
KETONES UR: NEGATIVE mg/dL
LEUKOCYTE UA: NEGATIVE
Nitrite: NEGATIVE
Protein, ur: NEGATIVE mg/dL
SPECIFIC GRAVITY, URINE: 1.025 (ref 1.005–1.030)
Urobilinogen, UA: 0.2 mg/dL (ref 0.0–1.0)
pH: 6 (ref 5.0–8.0)
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
## 2018-07-17 LAB — URINE CYTOLOGY ANCILLARY ONLY
Chlamydia: NEGATIVE
NEISSERIA GONORRHEA: NEGATIVE
TRICH (WINDOWPATH): NEGATIVE
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
## 2019-03-24 ENCOUNTER — Emergency Department (HOSPITAL_COMMUNITY)
Admission: EM | Admit: 2019-03-24 | Discharge: 2019-03-24 | Disposition: A | Discharge status: Home / Self Care | Payer: Self-pay | Attending: Emergency Medicine | Admitting: Emergency Medicine

## 2019-03-24 ENCOUNTER — Other Ambulatory Visit: Payer: Self-pay

## 2019-03-24 ENCOUNTER — Encounter (HOSPITAL_COMMUNITY): Payer: Self-pay

## 2019-03-24 ENCOUNTER — Emergency Department (HOSPITAL_COMMUNITY): Payer: Self-pay

## 2019-03-24 DIAGNOSIS — R197 Diarrhea, unspecified: Secondary | ICD-10-CM | POA: Insufficient documentation

## 2019-03-24 DIAGNOSIS — Z20828 Contact with and (suspected) exposure to other viral communicable diseases: Secondary | ICD-10-CM | POA: Insufficient documentation

## 2019-03-24 DIAGNOSIS — B349 Viral infection, unspecified: Secondary | ICD-10-CM | POA: Insufficient documentation

## 2019-03-24 DIAGNOSIS — J029 Acute pharyngitis, unspecified: Secondary | ICD-10-CM | POA: Insufficient documentation

## 2019-03-24 DIAGNOSIS — M7918 Myalgia, other site: Secondary | ICD-10-CM | POA: Insufficient documentation

## 2019-03-24 DIAGNOSIS — R630 Anorexia: Secondary | ICD-10-CM | POA: Insufficient documentation

## 2019-03-24 LAB — BASIC METABOLIC PANEL
Anion gap: 9 (ref 5–15)
BUN: 11 mg/dL (ref 6–20)
CO2: 23 mmol/L (ref 22–32)
Calcium: 9.2 mg/dL (ref 8.9–10.3)
Chloride: 107 mmol/L (ref 98–111)
Creatinine, Ser: 0.6 mg/dL (ref 0.44–1.00)
GFR calc Af Amer: >60 mL/min (ref >60)
GFR calc non Af Amer: >60 mL/min (ref >60)
Glucose, Bld: 85 mg/dL (ref 70–99)
Potassium: 3.8 mmol/L (ref 3.5–5.1)
Sodium: 139 mmol/L (ref 135–145)

## 2019-03-24 LAB — CBC
HCT: 44.1 % (ref 36.0–46.0)
Hemoglobin: 14.2 g/dL (ref 12.0–15.0)
MCH: 31.3 pg (ref 26.0–34.0)
MCHC: 32.2 g/dL (ref 30.0–36.0)
MCV: 97.4 fL (ref 80.0–100.0)
Platelets: 227 10*3/uL (ref 150–400)
RBC: 4.53 MIL/uL (ref 3.87–5.11)
RDW: 12.3 % (ref 11.5–15.5)
WBC: 6.4 10*3/uL (ref 4.0–10.5)
nRBC: 0 % (ref 0.0–0.2)

## 2019-03-24 LAB — SARS CORONAVIRUS 2 (TAT 6-24 HRS): SARS Coronavirus 2: NEGATIVE

## 2019-03-24 LAB — TROPONIN I (HIGH SENSITIVITY): Troponin I (High Sensitivity): <2 ng/L (ref <18)

## 2019-03-24 LAB — I-STAT BETA HCG BLOOD, ED (MC, WL, AP ONLY): I-stat hCG, quantitative: <5 m[IU]/mL (ref <5)

## 2019-03-24 MED ORDER — SODIUM CHLORIDE 0.9% FLUSH: 3.0000 mL | Freq: Once | INTRAVENOUS | Status: DC
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
## 2019-03-24 NOTE — Discharge Instructions (Addendum)
Please read instructions below.  You can alternate Tylenol/acetaminophen and Advil/ibuprofen/Motrin every 4 hours for sore throat, body aches, headache or fever.  Drink plenty of water.  Use saline nasal spray for congestion. Wash your hands frequently. It is recommended that you and your family self isolate at home until you know your Covid test results.  If your test is positive, you will be contacted by the hospital and given further isolation instructions. Follow up with your primary care provider if symptoms persist. Return to the ER for inability to swallow liquids, difficulty breathing, or new or worsening symptoms.

## 2019-03-24 NOTE — ED Triage Notes (Signed)
Pt presents w/all over body aches, chest pain, diarrhea, decrease appetite and sore throat x3 days

## 2019-04-11 IMAGING — DX DG CHEST 2V
2 series · 2 of 2 positions shown · non-contrast
Comparison: Chest x-ray of March 08, 2017

CLINICAL DATA: Chest pain, shortness of breath, and flu-like
symptoms since yesterday morning.

EXAM:
CHEST  2 VIEW

[chest pa]
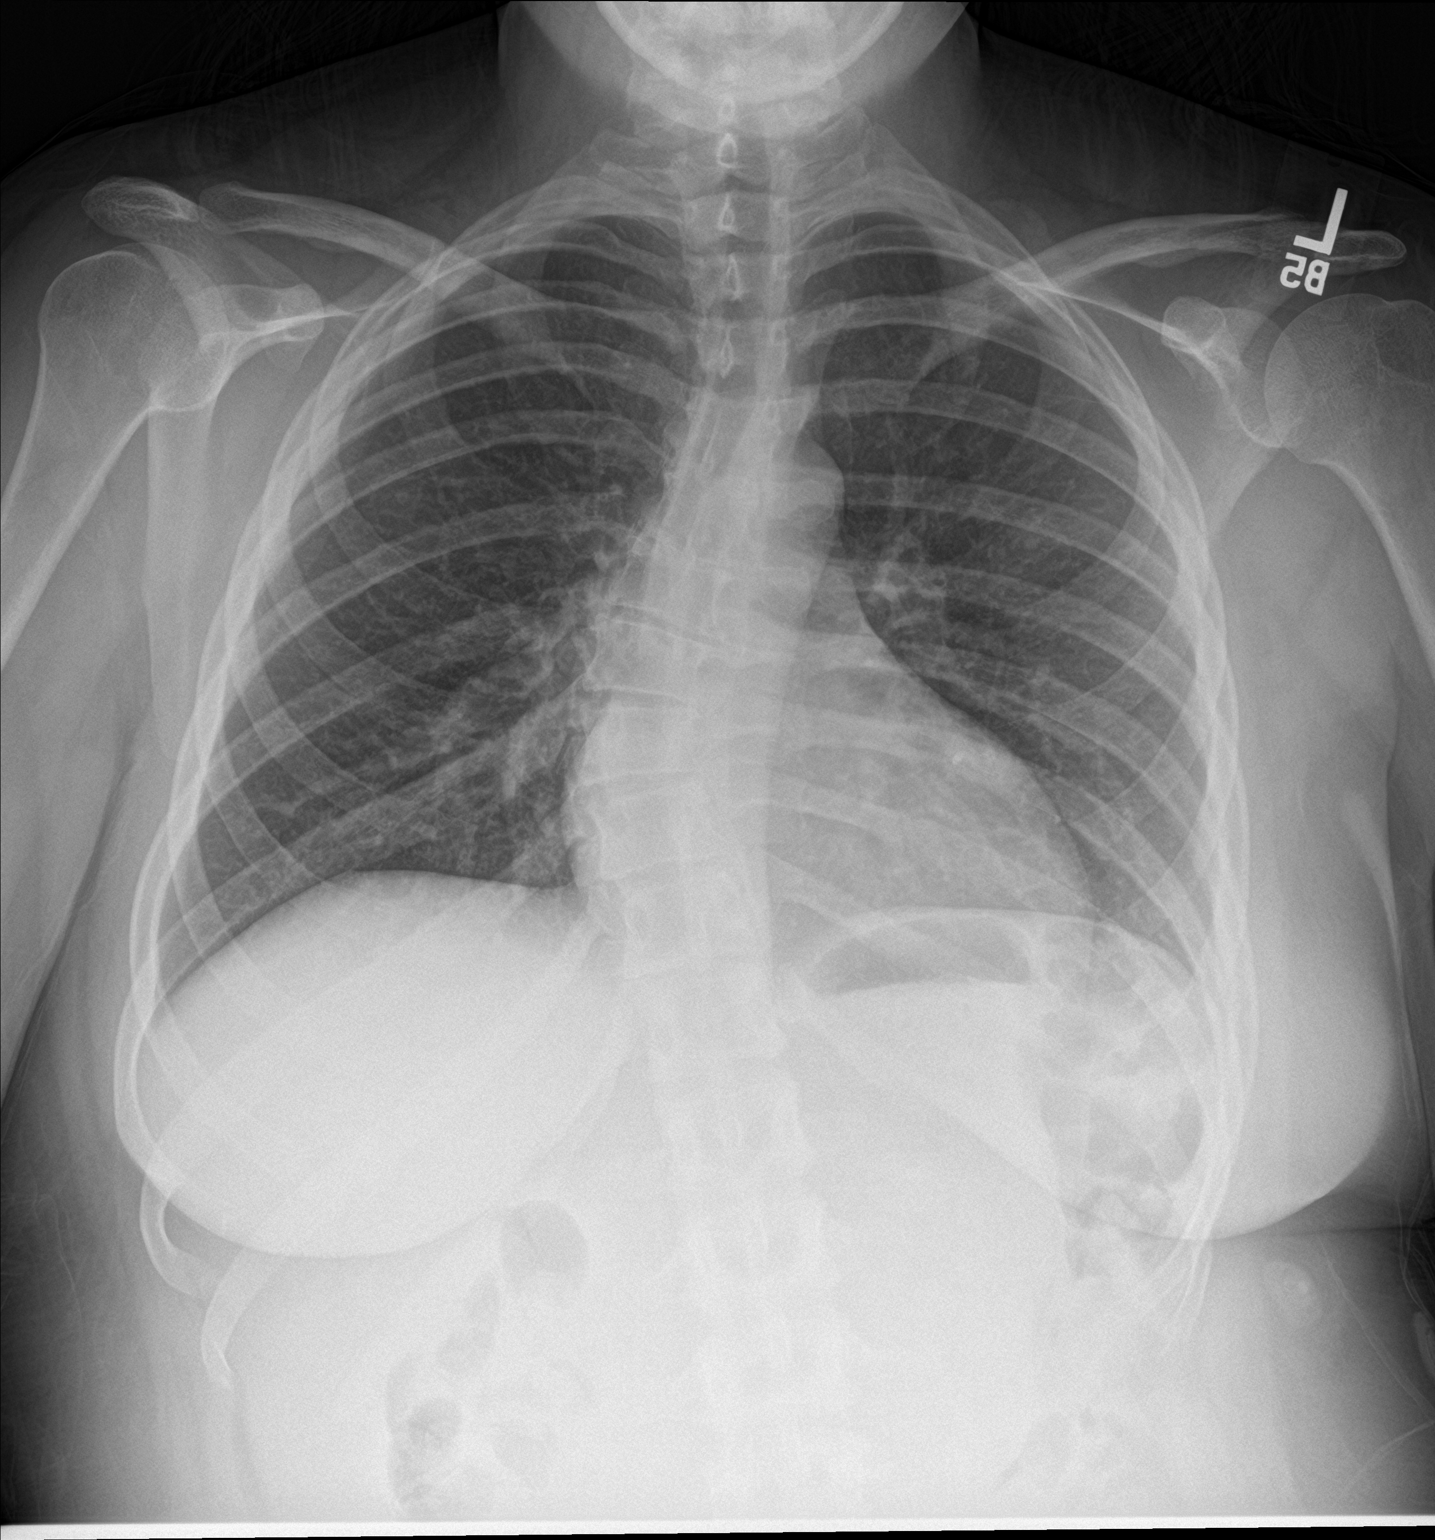

[chest lat]
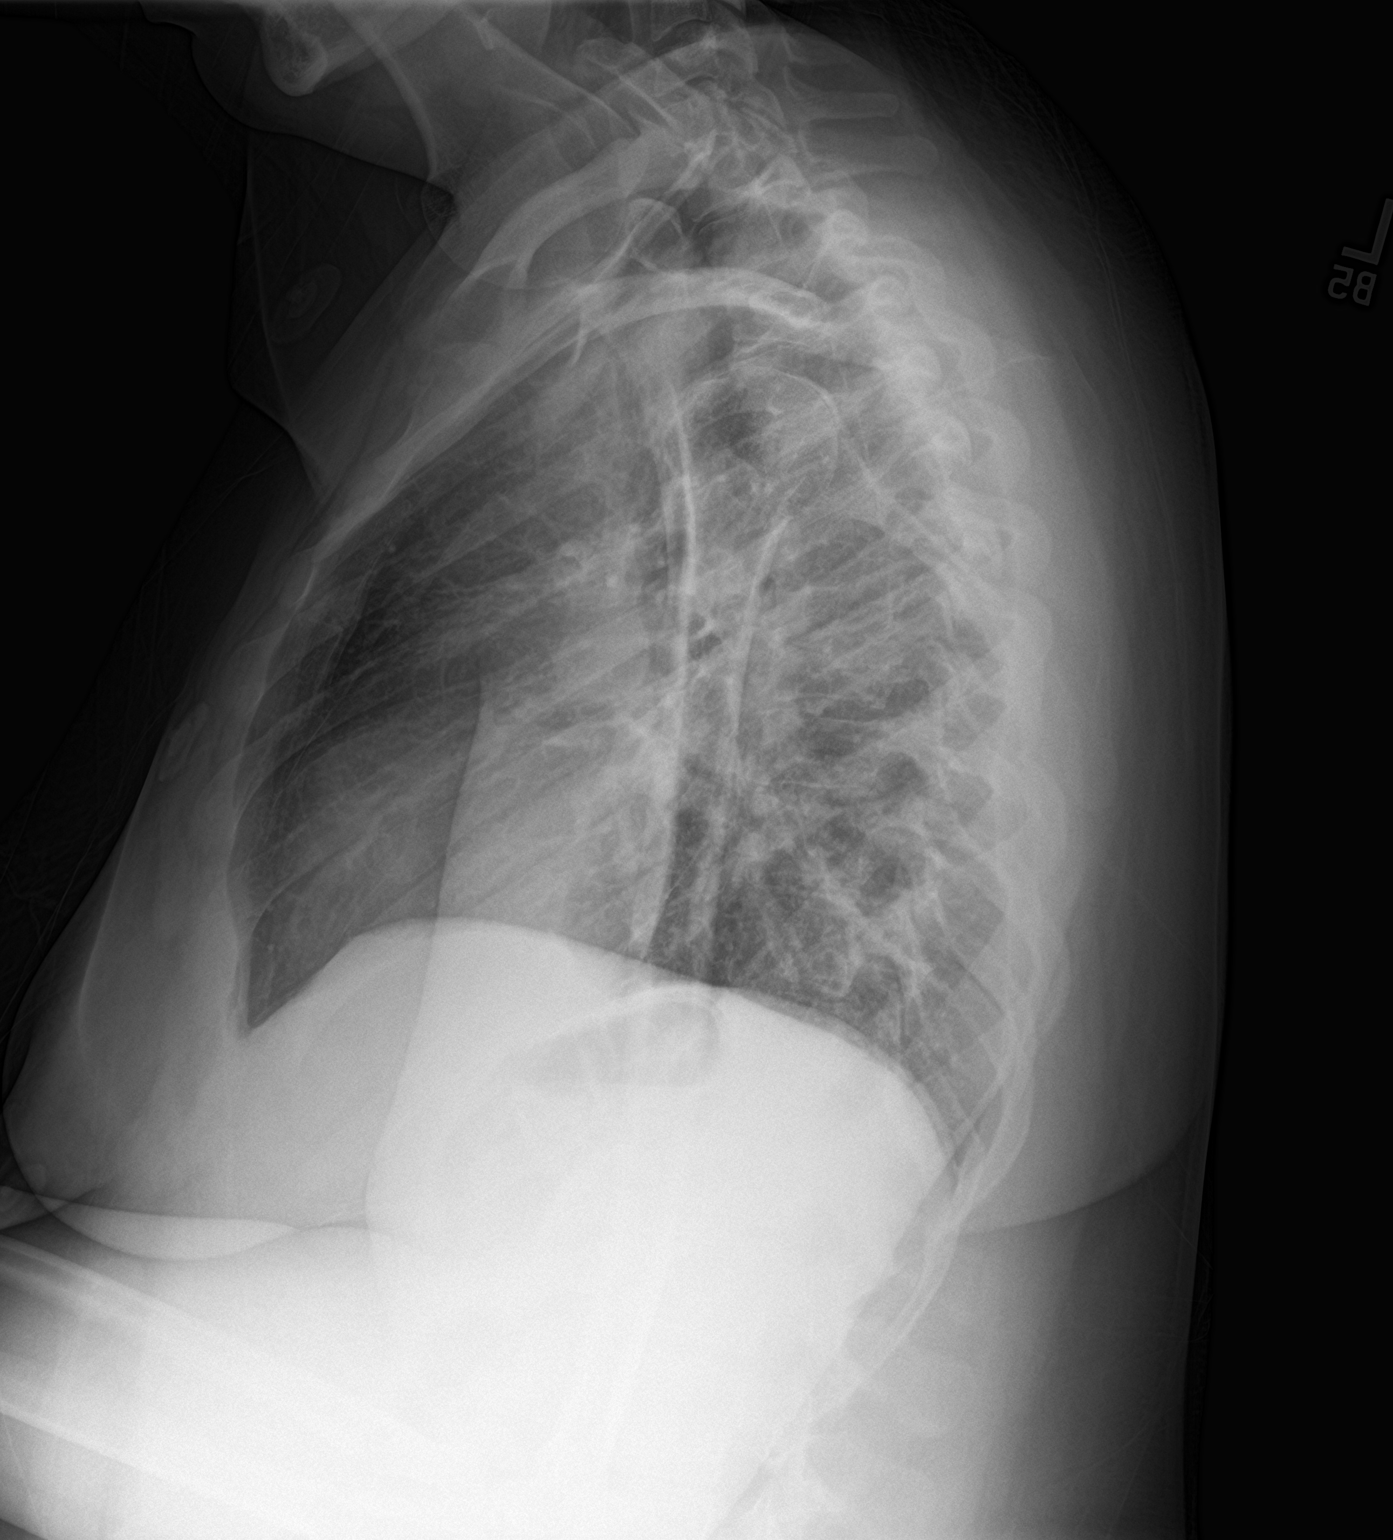

[2 of 2 positions shown; findings below may reference images not displayed]

FINDINGS: The lungs are well-expanded. There is no focal infiltrate or pleural
effusion. The heart and pulmonary vascularity are normal. The
mediastinum is normal in width. There is moderate dextrocurvature
centered in the lower thoracic spine.
IMPRESSION: There is no pneumonia nor other active cardiopulmonary disease.

Stable dextroscoliosis centered at T9.

## 2019-06-09 ENCOUNTER — Other Ambulatory Visit: Payer: Self-pay

## 2019-06-09 ENCOUNTER — Ambulatory Visit (HOSPITAL_COMMUNITY)
Admission: EM | Admit: 2019-06-09 | Discharge: 2019-06-09 | Disposition: A | Discharge status: Home / Self Care | Payer: Self-pay | Attending: Urgent Care | Admitting: Urgent Care

## 2019-06-09 ENCOUNTER — Encounter (HOSPITAL_COMMUNITY): Payer: Self-pay

## 2019-06-09 DIAGNOSIS — G43809 Other migraine, not intractable, without status migrainosus: Secondary | ICD-10-CM

## 2019-06-09 DIAGNOSIS — N946 Dysmenorrhea, unspecified: Secondary | ICD-10-CM

## 2019-06-09 DIAGNOSIS — R109 Unspecified abdominal pain: Secondary | ICD-10-CM

## 2019-06-09 DIAGNOSIS — R102 Pelvic and perineal pain: Secondary | ICD-10-CM

## 2019-06-09 MED ORDER — ONDANSETRON 4 MG PO TBDP
ORAL_TABLET | ORAL | Status: AC
  Filled 2019-06-09: qty 2

## 2019-06-09 MED ORDER — KETOROLAC TROMETHAMINE 60 MG/2ML IM SOLN
INTRAMUSCULAR | Status: AC
  Filled 2019-06-09: qty 2

## 2019-06-09 MED ORDER — MEGESTROL ACETATE 40 MG PO TABS: 80.0000 mg | ORAL_TABLET | Freq: Every day | ORAL | 0 refills | Status: DC

## 2019-06-09 MED ORDER — ONDANSETRON 4 MG PO TBDP
8.0000 mg | ORAL_TABLET | Freq: Once | ORAL | Status: AC
  Administered 2019-06-09: 8 mg via ORAL

## 2019-06-09 MED ORDER — KETOROLAC TROMETHAMINE 60 MG/2ML IM SOLN
60.0000 mg | Freq: Once | INTRAMUSCULAR | Status: AC
  Administered 2019-06-09: 60 mg via INTRAMUSCULAR

## 2019-06-09 MED ORDER — PROMETHAZINE HCL 25 MG RE SUPP: 25.0000 mg | Freq: Three times a day (TID) | RECTAL | 0 refills | Status: DC | PRN

## 2019-06-09 MED ORDER — ONDANSETRON 8 MG PO TBDP: 8.0000 mg | ORAL_TABLET | Freq: Three times a day (TID) | ORAL | 0 refills | Status: DC | PRN

## 2019-06-09 NOTE — ED Triage Notes (Signed)
Pt state she has terrible periods. Pt states she has no insurance and she is unable to see her PCP. Pt states she usually get injections for her cramps and migraines.
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
## 2019-10-08 ENCOUNTER — Ambulatory Visit: Payer: Self-pay | Admitting: *Deleted

## 2019-10-08 NOTE — Telephone Encounter (Signed)
Per initial encounter, "Patient is requesting a nurse to call back due to patient has symptoms. Patient is wondering what she should do further."; contacted pt regarding symptoms; She has a sore throat that is changing her voice; she felt like she had a fever, but ; she is also having body aches ,headache, and nausea; her symptoms started on 10/07/19; pt also informed that testing can be done at community sites, and do not require MD's order, and some commercial pharmacies offer testing; recommendations given per nurse triage protocol; tier of symptoms, and quarantine reviewed; she verbalized understanding.  Reason for Disposition . COVID-19 Prevention and Healthy Living, questions about  Answer Assessment - Initial Assessment Questions 1. COVID-19 DIAGNOSIS: "Who made your Coronavirus (COVID-19) diagnosis?" "Was it confirmed by a positive lab test?" If not diagnosed by a HCP, ask "Are there lots of cases (community spread) where you live?" (See public health department website, if unsure)   Community spread 2. COVID-19 EXPOSURE: "Was there any known exposure to COVID before the symptoms began?" CDC Definition of close contact: within 6 feet (2 meters) for a total of 15 minutes or more over a 24-hour period.    No; saw boyfriend on 10/07/19 3. ONSET: "When did the COVID-19 symptoms start?"      10/02/19 4. WORST SYMPTOM: "What is your worst symptom?" (e.g., cough, fever, shortness of breath, muscle aches)    Sore throat 5. COUGH: "Do you have a cough?" If so, ask: "How bad is the cough?"       no 6. FEVER: "Do you have a fever?" If so, ask: "What is your temperature, how was it measured, and when did it start?"    Feels like she has one, but has not taken temp 7. RESPIRATORY STATUS: "Describe your breathing?" (e.g., shortness of breath, wheezing, unable to speak)     Breathing ok 8. BETTER-SAME-WORSE: "Are you getting better, staying the same or getting worse compared to yesterday?"  If getting  worse, ask, "In what way?"     Worse, due to body aches 9. HIGH RISK DISEASE: "Do you have any chronic medical problems?" (e.g., asthma, heart or lung disease, weak immune system, obesity, etc.)   obesity 10. PREGNANCY: "Is there any chance you are pregnant?" "When was your last menstrual period?"       No, LMP 09/28/19 11. OTHER SYMPTOMS: "Do you have any other symptoms?"  (e.g., chills, fatigue, headache, loss of smell or taste, muscle pain, sore throat; new loss of smell or taste especially support the diagnosis of COVID-19)       Fatigue, sore throat, body aches, headache, nausea, ? fever  Protocols used: CORONAVIRUS (COVID-19) DIAGNOSED OR SUSPECTED-A-AH

## 2019-10-14 ENCOUNTER — Encounter: Payer: Self-pay | Admitting: Emergency Medicine

## 2019-10-14 ENCOUNTER — Emergency Department (HOSPITAL_COMMUNITY): Payer: Self-pay

## 2019-10-14 ENCOUNTER — Emergency Department (HOSPITAL_COMMUNITY)
Admission: EM | Admit: 2019-10-14 | Discharge: 2019-10-14 | Disposition: A | Discharge status: Home / Self Care | Payer: Self-pay | Attending: Emergency Medicine | Admitting: Emergency Medicine

## 2019-10-14 DIAGNOSIS — R112 Nausea with vomiting, unspecified: Secondary | ICD-10-CM | POA: Insufficient documentation

## 2019-10-14 DIAGNOSIS — R519 Headache, unspecified: Secondary | ICD-10-CM | POA: Insufficient documentation

## 2019-10-14 DIAGNOSIS — Z20822 Contact with and (suspected) exposure to covid-19: Secondary | ICD-10-CM | POA: Insufficient documentation

## 2019-10-14 DIAGNOSIS — J069 Acute upper respiratory infection, unspecified: Secondary | ICD-10-CM | POA: Insufficient documentation

## 2019-10-14 LAB — SARS CORONAVIRUS 2 (TAT 6-24 HRS): SARS Coronavirus 2: NEGATIVE

## 2019-10-14 MED ORDER — PROCHLORPERAZINE EDISYLATE 10 MG/2ML IJ SOLN
10.0000 mg | Freq: Once | INTRAMUSCULAR | Status: AC
  Administered 2019-10-14: 10 mg via INTRAVENOUS
  Filled 2019-10-14: qty 2

## 2019-10-14 MED ORDER — DIPHENHYDRAMINE HCL 50 MG/ML IJ SOLN
25.0000 mg | Freq: Once | INTRAMUSCULAR | Status: AC
  Administered 2019-10-14: 25 mg via INTRAVENOUS
  Filled 2019-10-14: qty 1

## 2019-10-14 MED ORDER — ONDANSETRON HCL 4 MG/2ML IJ SOLN
4.0000 mg | Freq: Once | INTRAMUSCULAR | Status: AC
  Administered 2019-10-14: 4 mg via INTRAVENOUS
  Filled 2019-10-14: qty 2

## 2019-10-14 MED ORDER — SODIUM CHLORIDE 0.9 % IV BOLUS
1000.0000 mL | Freq: Once | INTRAVENOUS | Status: AC
  Administered 2019-10-14: 1000 mL via INTRAVENOUS

## 2019-10-14 MED ORDER — ONDANSETRON HCL 4 MG PO TABS: 4.0000 mg | ORAL_TABLET | Freq: Four times a day (QID) | ORAL | 0 refills | Status: DC

## 2019-10-14 NOTE — ED Notes (Signed)
Took patient saline lock out patient is resting with call bell in reach 
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
## 2019-10-14 NOTE — ED Triage Notes (Signed)
Pt states since last Tuesday she has had congestion with productive cough, facial pain and headache. Pt states she got a - covid test at [facility] on Friday, pt has been using OTC allergies medications and tylenol with no relief.

## 2019-10-14 NOTE — ED Notes (Signed)
Patient verbalizes understanding of discharge instructions. Opportunity for questioning and answers were provided. Armband removed by staff, pt discharged from ED.  

## 2019-10-14 NOTE — Discharge Instructions (Addendum)
Continue with taking Flonase, Zyrtec, ibuprofen/Tylenol for body aches.  Take your prescribed migraine medication as for headaches.  I will prescribe you a nausea medication called Zofran.  Make sure to drink plenty of fluids.  Return to the ER if your symptoms worsen.

## 2019-11-06 ENCOUNTER — Ambulatory Visit: Payer: Self-pay | Admitting: Family Medicine

## 2019-11-27 ENCOUNTER — Ambulatory Visit: Payer: Self-pay | Admitting: Family Medicine

## 2019-11-27 DIAGNOSIS — Z0289 Encounter for other administrative examinations: Secondary | ICD-10-CM

## 2019-12-11 ENCOUNTER — Telehealth: Payer: Self-pay | Admitting: Family Medicine

## 2019-12-11 NOTE — Telephone Encounter (Signed)
Pt was no show for appt 11/27/19. 2nd occurrence as new patient appt. The first pt was having car trouble. Letter mailed.  PCP,  Please reply back with corresponding letter matching appropriate follow up needs.  A - No follow up necessary B - Follow up urgent - locate patient immediately to schedule appointment. C - Follow up necessary. Contact patient and schedule visit w/in 7 days. D - Follow up necessary. Contact patient and schedule visit w/in 2-4 weeks.  E - Follow up necessary. Contact patient and schedule visit w/in 3 months.

## 2020-06-14 ENCOUNTER — Ambulatory Visit: Payer: Self-pay | Admitting: Nurse Practitioner

## 2020-06-14 DIAGNOSIS — Z0289 Encounter for other administrative examinations: Secondary | ICD-10-CM

## 2020-07-04 ENCOUNTER — Encounter: Payer: Self-pay | Admitting: Nurse Practitioner

## 2020-07-25 ENCOUNTER — Encounter: Payer: Self-pay | Admitting: Internal Medicine

## 2020-07-25 ENCOUNTER — Telehealth (INDEPENDENT_AMBULATORY_CARE_PROVIDER_SITE_OTHER): Payer: No Typology Code available for payment source | Admitting: Internal Medicine

## 2020-07-25 DIAGNOSIS — F411 Generalized anxiety disorder: Secondary | ICD-10-CM

## 2020-07-25 DIAGNOSIS — Z8632 Personal history of gestational diabetes: Secondary | ICD-10-CM

## 2020-07-25 DIAGNOSIS — Z7689 Persons encountering health services in other specified circumstances: Secondary | ICD-10-CM

## 2020-07-25 DIAGNOSIS — Z87898 Personal history of other specified conditions: Secondary | ICD-10-CM

## 2020-07-25 DIAGNOSIS — E669 Obesity, unspecified: Secondary | ICD-10-CM | POA: Diagnosis not present

## 2020-07-25 NOTE — Progress Notes (Signed)
Irregular menstrual cycle Anxiety- given injection every 3 months

## 2020-07-25 NOTE — Progress Notes (Signed)
Virtual Visit via Telephone Note  I connected with Sandra Schultz, on 07/25/2020 at 9:17 AM by telephone due to the COVID-19 pandemic and verified that I am speaking with the correct person using two identifiers.   Consent: I discussed the limitations, risks, security and privacy concerns of performing an evaluation and management service by telephone and the availability of in person appointments. I also discussed with the patient that there may be a patient responsible charge related to this service. The patient expressed understanding and agreed to proceed.   Location of Patient: Home   Location of Provider: Clinic    Persons participating in Telemedicine visit: Christene Pounds [facility] Dr. Earlene Plater      History of Present Illness: Patient has a visit to establish to care. Last seen by PCP in 2020 because she stopped working at her previous employer. PMH of migraines, 3-5 occurrences per month.   Reports history of anxiety and depression. Says no current feelings of depression. Says anxiety is "kind of okay". Has been on medication but last was in 2020, doesn't recall the name of medication. One of the triggers for her anxiety is her weight gain since August. She reports she moved to a less safe neighborhood and her previous neighborhood had walking trails/park which this neighborhood lacks. 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   Reports history of GDM, diet controlled. Then had prediabetes afterwards related to her weight. Says has not had glucose monitoring in years.    Depression screen [facility] 2/9 07/25/2020 12/23/2014  Decreased Interest 0 0  Down, Depressed, Hopeless 0 0  PHQ - 2 Score 0 0  Tired, decreased energy 0 -  Change in appetite 0 -  Feeling bad or failure about yourself  0 -  Trouble concentrating 0 -  Moving slowly or fidgety/restless 0 -  Suicidal thoughts 0 -   GAD 7 : Generalized Anxiety Score 07/25/2020   Nervous, Anxious, on Edge 2  Control/stop worrying 2  Worry too much - different things 2  Trouble relaxing 2  Restless 2  Easily annoyed or irritable 2  Afraid - awful might happen 2  Total GAD 7 Score 14     Past Medical History:  Diagnosis Date  . Depression 2011   postpartum, after first child, not after second child  . GERD (gastroesophageal reflux disease)    Tums   . Headache    Extra Strength Tylenol helps  . Seasonal allergies    Allergies  Allergen Reactions  . Penicillins Other (See Comments)     Unknown; childhood reaction  Has patient had a PCN reaction causing immediate rash, facial/tongue/throat swelling, SOB or lightheadedness with hypotension: No Has patient had a PCN reaction causing severe rash involving mucus membranes or skin necrosis: No Has patient had a PCN reaction that required hospitalization No Has patient had a PCN reaction occurring within the last 10 years: No If all of the above answers are "NO", then may proceed with Cephalosporin use.    . Pomegranate Extract Hives and Itching  . Reglan [Metoclopramide] Other (See Comments)    Uneasy, antsy     Current Outpatient Medications on File Prior to Visit  Medication Sig Dispense Refill  . [DISCONTINUED] estradiol cypionate (DEPO-ESTRADIOL) 5 MG/ML injection Inject into the muscle every 28 (twenty-eight) days.     No current facility-administered medications on file prior to visit.    Observations/Objective: NAD. Speaking clearly.  Work of breathing normal.  Alert and oriented. Mood appropriate.   Assessment and Plan: 1. Encounter to establish care Reviewed patient's PMH, social history, surgical history, and medications.  Is overdue for annual exam, screening blood work, and health maintenance topics. Have asked patient to return for visit to address these items.   2. Generalized anxiety disorder GAD-7 score 14 concerning for moderate anxiety. She has been on prn medications in the  past. Discussed option of starting medication and of counseling. She would like to start with counseling first and then determine if she would like to pursue additional management with pharmacotherapy. Will place referral to psychology. Will have interim visit with Jenel Lucks, LCSW scheduled. Discussed Calm and Headspace apps for additional support at home.  - Ambulatory referral to Psychology  3. Obesity without serious comorbidity, unspecified classification, unspecified obesity type Barriers to weight control are lack of childcare, lack of safe nearby outdoor spaces for exercise. She has never had nutrition counseling. Will place referral.  - Amb ref to Medical Nutrition Therapy-MNT  4. History of gestational diabetes 5. History of prediabetes Will need to monitor A1c at upcoming visit.    Follow Up Instructions: Annual exam    I discussed the assessment and treatment plan with the patient. The patient was provided an opportunity to ask questions and all were answered. The patient agreed with the plan and demonstrated an understanding of the instructions.   The patient was advised to call back or seek an in-person evaluation if the symptoms worsen or if the condition fails to improve as anticipated.     I provided 16 minutes total of non-face-to-face time during this encounter including median intraservice time, reviewing previous notes, investigations, ordering medications, medical decision making, coordinating care and patient verbalized understanding at the end of the visit.    Marcy Siren, D.O. Primary Care at [facility]  07/25/2020, 9:17 AM

## 2020-07-27 ENCOUNTER — Encounter: Payer: Self-pay | Admitting: Dietician

## 2020-07-27 ENCOUNTER — Other Ambulatory Visit: Payer: Self-pay

## 2020-07-27 ENCOUNTER — Encounter: Payer: No Typology Code available for payment source | Attending: Internal Medicine | Admitting: Dietician

## 2020-07-27 VITALS — Ht 61.0 in | Wt 167.3 lb

## 2020-07-27 DIAGNOSIS — E669 Obesity, unspecified: Secondary | ICD-10-CM | POA: Diagnosis present

## 2020-07-27 NOTE — Patient Instructions (Signed)
Create a playlist this weekend so that you can dance in the evenings. Try to dance 1-2 times each week!  Have a piece or two of fruit in the morning with your protein coffee!  Try to have three meals a day about 5-6 hours apart.  Work towards going to [employer] 3 days a week for 45 minutes.  Work towards a weight loss of 1-2 pounds a week, and only weigh yourself once a week.

## 2020-07-27 NOTE — Progress Notes (Signed)
Medical Nutrition Therapy  Appointment Start time:  [phone]  Appointment End time:  1215  Primary concerns today: Weight Loss  Referral diagnosis: E66.9 Obesity Preferred learning style: No preference indicated Learning readiness: Ready   NUTRITION ASSESSMENT   Anthropometrics  Ht: 5'1" Wt: 167.3 lbs Body mass index is 31.61 kg/m.   Clinical Medical Hx: GERD Medications: N/A Labs: N/A Notable Signs/Symptoms: N/A  Lifestyle & Dietary Hx Pt reports that they don't like to eat breakfast or breakfast foods. Pt usually has a low carb protein shake in their coffee each morning. Pt reports avoiding OJ or acidic foods that trigger GERD. Pt reports losing weight doing keto, but began to have bad carb cravings and gained weight back.  Pt reports weighing themselves daily, and checking their blood ketones 3 times day. Pt states this became too stressful. Pt reports having a body composition scale and was unhappy about the numbers.Pt reports GERD symptoms subsided as they lost weight. Pt reports they have been doing terribly at night now. Pt reports taking their kids to the park where they previously lived, likes to dance. Pt reports moving recently and it has been stressful. States there is nowhere to walk or play with their kids. Pt states they have experienced a lot of pressure around dieting and their weight in the past.   Estimated daily fluid intake: 64 oz Supplements: None Sleep: Well Stress / self-care: Currently high Current average weekly physical activity: ADLs  24-Hr Dietary Recall First Meal: coffee with a low carb premier protein shake (7:30 - 8 am) Snack: none Second Meal: chicken hibachi with broccoli, hibachi, and rice (11:30 am - 2:30 pm) Snack: none Third Meal: 1/2 slice of pizza, glass of wine, 1 slice of B-day cake, small amount of ice cream (7:00 - 9:00 pm) Snack: none Beverages: coffee, wine, 2 bottles of water   NUTRITION DIAGNOSIS  NB-1.1 Food and  nutrition-related knowledge deficit As related to obesity.  As evidenced by BMI of 31.61 kg/m2, and history of keto diet leading to cravings and weight gain.   NUTRITION INTERVENTION  Nutrition education (E-1) on the following topics:   Counseled patient on ways to begin recognizing each of the food groups from the balanced plate in their own meals, and how close they are to fitting the recommended proportions of the balanced plate. Educated patient on the nutritional value of each food group on the balanced plate model. Counseled patient on beginning to rebuild their trust in themselves to make the right food choices for their health. Educated patient on mindful eating, including listening to their body's hunger and satiety cues, as well as eating slowly and allowing meals to be more of a sensory experience. Counseled patient on allowing themselves to be present in their emotions when they consider emotional eating. Advised patient to evaluate whether the impulse to eat is hunger based, or emotionally driven. Educated patient on identifying factors that lead to undesirable behaviors and looking backwards at the chain of behaviors or actions that led to this behavior. Educate patient on the importance of intervening at any one of these points to change the trajectory of their decision making.   Handouts Provided Include   N/A  Learning Style & Readiness for Change Teaching method utilized: Visual & Auditory  Demonstrated degree of understanding via: Teach Back  Barriers to learning/adherence to lifestyle change: None  Goals Established by Pt  Create a playlist this weekend so that you can dance in the evenings. Try to dance  1-2 times each week!  Have a piece or two of fruit in the morning with your protein coffee!  Try to have three meals a day about 5-6 hours apart.  Work towards going to [employer] 3 days a week for 45 minutes.  Work towards a weight loss of 1-2 pounds a week, and  only weigh yourself once a week.   MONITORING & EVALUATION Dietary intake, weekly physical activity, and Late night cravings in 1 month.  Next Steps  Patient is to follow up with RDN.

## 2020-08-11 ENCOUNTER — Encounter: Payer: Self-pay | Admitting: Internal Medicine

## 2020-08-15 ENCOUNTER — Ambulatory Visit: Payer: Self-pay | Admitting: Clinical

## 2020-08-24 ENCOUNTER — Ambulatory Visit: Payer: No Typology Code available for payment source | Admitting: Dietician

## 2020-09-06 ENCOUNTER — Encounter: Payer: Self-pay | Admitting: Internal Medicine

## 2020-09-16 ENCOUNTER — Ambulatory Visit (INDEPENDENT_AMBULATORY_CARE_PROVIDER_SITE_OTHER): Payer: Self-pay

## 2020-09-16 ENCOUNTER — Other Ambulatory Visit: Payer: Self-pay

## 2020-09-16 ENCOUNTER — Ambulatory Visit (HOSPITAL_COMMUNITY)
Admission: EM | Admit: 2020-09-16 | Discharge: 2020-09-16 | Disposition: A | Discharge status: Home / Self Care | Payer: Self-pay | Attending: Physician Assistant | Admitting: Physician Assistant

## 2020-09-16 ENCOUNTER — Encounter (HOSPITAL_COMMUNITY): Payer: Self-pay

## 2020-09-16 DIAGNOSIS — W19XXXA Unspecified fall, initial encounter: Secondary | ICD-10-CM

## 2020-09-16 DIAGNOSIS — M25521 Pain in right elbow: Secondary | ICD-10-CM

## 2020-09-16 DIAGNOSIS — M79601 Pain in right arm: Secondary | ICD-10-CM

## 2020-09-16 MED ORDER — NAPROXEN 500 MG PO TABS: 500.0000 mg | ORAL_TABLET | Freq: Two times a day (BID) | ORAL | 0 refills | Status: DC
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
## 2020-09-16 NOTE — Discharge Instructions (Addendum)
Your x-ray was negative.  Take Naprosyn for pain twice a day as needed.  He should not take additional NSAIDs including aspirin, ibuprofen/Advil, naproxen/Aleve with this medication as it can cause stomach bleeding.  Try to rest the area and avoid strenuous activity or heavy lifting. Follow-up with orthopedics if symptoms persist.

## 2020-09-16 NOTE — ED Triage Notes (Signed)
Pt in with c/o right arm pain that radiates to her shoulder and hand that started on saturday after a fall  Pt states she is having intermittent arm numbness

## 2020-12-08 ENCOUNTER — Ambulatory Visit (HOSPITAL_COMMUNITY)
Admission: EM | Admit: 2020-12-08 | Discharge: 2020-12-08 | Disposition: A | Discharge status: Home / Self Care | Payer: No Typology Code available for payment source

## 2020-12-08 ENCOUNTER — Encounter (HOSPITAL_COMMUNITY): Payer: Self-pay | Admitting: Emergency Medicine

## 2020-12-08 ENCOUNTER — Other Ambulatory Visit: Payer: Self-pay

## 2020-12-08 DIAGNOSIS — T148XXA Other injury of unspecified body region, initial encounter: Secondary | ICD-10-CM | POA: Diagnosis not present
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
## 2020-12-08 NOTE — Discharge Instructions (Addendum)
You can apply neosporin as needed for pain.    Follow up if you develop any worsening pain, redness, swelling, or discharge.

## 2020-12-08 NOTE — ED Triage Notes (Signed)
Pt is present today with an ingrown hair on the right side of her groin. Pt states that she noticed it x4 days ago.

## 2021-07-13 ENCOUNTER — Encounter (HOSPITAL_BASED_OUTPATIENT_CLINIC_OR_DEPARTMENT_OTHER): Payer: Self-pay | Admitting: Emergency Medicine

## 2021-07-13 ENCOUNTER — Emergency Department (HOSPITAL_BASED_OUTPATIENT_CLINIC_OR_DEPARTMENT_OTHER)
Admission: EM | Admit: 2021-07-13 | Discharge: 2021-07-13 | Disposition: A | Discharge status: Home / Self Care | Payer: No Typology Code available for payment source | Attending: Emergency Medicine | Admitting: Emergency Medicine

## 2021-07-13 ENCOUNTER — Other Ambulatory Visit (HOSPITAL_BASED_OUTPATIENT_CLINIC_OR_DEPARTMENT_OTHER): Payer: Self-pay

## 2021-07-13 ENCOUNTER — Other Ambulatory Visit: Payer: Self-pay

## 2021-07-13 DIAGNOSIS — J358 Other chronic diseases of tonsils and adenoids: Secondary | ICD-10-CM | POA: Insufficient documentation

## 2021-07-13 DIAGNOSIS — Z20822 Contact with and (suspected) exposure to covid-19: Secondary | ICD-10-CM | POA: Insufficient documentation

## 2021-07-13 DIAGNOSIS — R197 Diarrhea, unspecified: Secondary | ICD-10-CM | POA: Insufficient documentation

## 2021-07-13 DIAGNOSIS — J020 Streptococcal pharyngitis: Secondary | ICD-10-CM | POA: Insufficient documentation

## 2021-07-13 DIAGNOSIS — M791 Myalgia, unspecified site: Secondary | ICD-10-CM | POA: Insufficient documentation

## 2021-07-13 LAB — RESP PANEL BY RT-PCR (FLU A&B, COVID) ARPGX2
Influenza A by PCR: NEGATIVE
Influenza B by PCR: NEGATIVE
SARS Coronavirus 2 by RT PCR: NEGATIVE

## 2021-07-13 LAB — GROUP A STREP BY PCR: Group A Strep by PCR: DETECTED — AB

## 2021-07-13 MED ORDER — AZITHROMYCIN 250 MG PO TABS
250.0000 mg | ORAL_TABLET | Freq: Every day | ORAL | 0 refills | Status: DC
  Filled 2021-07-13: qty 6, 5d supply, fill #0

## 2021-07-13 NOTE — ED Provider Notes (Signed)
?[facility] EM[facility]ENC[facility] DEPARTMENT ?Provider Note ? ? ?CSN: [id number] ?Arrival date & time: 07/13/21  1018 ? ?  ? ?History ? ?Chief Complaint  ?Patient presents with  ? Generalized Body Aches  ? Sore Throat  ? ? ?CAMRIE STOCK is a 31 y.o. female with history of depression, GERD, seasonal allergies.  Patient presents to ED for evaluation of sore throat, fevers that she began experiencing last night.  Patient reports that recently her daughter was diagnosed with strep throat.  Patient states that her sore throat has progressively worsened since yesterday and describes a stabbing pain.  Patient states that she took ibuprofen with mild relief of symptoms.  Patient is endorsing fever, sore throat, headaches, body aches, diarrhea.  Patient denies nausea, vomiting, abdominal pain, drooling, trouble swallowing. ? ? ?Sore Throat ?Associated symptoms include headaches. Pertinent negatives include no abdominal pain.  ? ?  ? ?Home Medications ?Prior to Admission medications   ?Medication Sig Start Date End Date Taking? Authorizing Provider  ?azithromycin (ZITHROMAX) 250 MG tablet Take 1 tablet (250 mg total) by mouth daily. Take first 2 tablets together, then 1 every day until finished. 07/13/21  Yes Al Decant, PA-C  ?naproxen (NAPROSYN) 500 MG tablet Take 1 tablet (500 mg total) by mouth 2 (two) times daily. 09/16/20   Raspet, Noberto Retort, PA-C  ?estradiol cypionate (DEPO-ESTRADIOL) 5 MG/ML injection Inject into the muscle every 28 (twenty-eight) days.  06/09/19  [provider]  ?   ? ?Allergies    ?Penicillins, Pomegranate extract, and Reglan [metoclopramide]   ? ?Review of Systems   ?Review of Systems  ?Constitutional:  Positive for chills and fever.  ?HENT:  Positive for sore throat. Negative for drooling and trouble swallowing.   ?Gastrointestinal:  Positive for diarrhea. Negative for abdominal pain, nausea and vomiting.  ?Musculoskeletal:  Positive for myalgias.  ?Neurological:  Positive for  headaches.  ?All other systems reviewed and are negative. ? ?Physical Exam ?Updated Vital Signs ?BP 113/77   Pulse (!) 108   Temp 99.5 ?F (37.5 ?C) (Oral)   Resp 16   SpO2 100%  ?Physical Exam ?Vitals and nursing note reviewed.  ?Constitutional:   ?   General: She is not in acute distress. ?   Appearance: She is ill-appearing. She is not toxic-appearing or diaphoretic.  ?HENT:  ?   Head: Normocephalic and atraumatic.  ?   Nose: Congestion present.  ?   Mouth/Throat:  ?   Mouth: Mucous membranes are moist.  ?   Pharynx: Uvula midline. Pharyngeal swelling, oropharyngeal exudate and posterior oropharyngeal erythema present. No uvula swelling.  ?   Tonsils: Tonsillar exudate present. No tonsillar abscesses. 1+ on the right. 1+ on the left.  ?Eyes:  ?   Conjunctiva/sclera: Conjunctivae normal.  ?   Pupils: Pupils are equal, round, and reactive to light.  ?Cardiovascular:  ?   Rate and Rhythm: Normal rate and regular rhythm.  ?Pulmonary:  ?   Effort: Pulmonary effort is normal.  ?   Breath sounds: Normal breath sounds.  ?Abdominal:  ?   General: Bowel sounds are normal.  ?   Palpations: Abdomen is soft.  ?   Tenderness: There is no abdominal tenderness.  ?Musculoskeletal:  ?   Cervical back: Normal range of motion and neck supple.  ?Lymphadenopathy:  ?   Cervical: Cervical adenopathy present.  ?Skin: ?   General: Skin is warm and dry.  ?   Capillary Refill: Capillary refill takes less than 2 seconds.  ?  Neurological:  ?   Mental Status: She is alert and oriented to person, place, and time.  ? ? ?ED Results / Procedures / Treatments   ?Labs ?(all labs ordered are listed, but only abnormal results are displayed) ?Labs Reviewed  ?GROUP A STREP BY PCR - Abnormal; Notable for the following components:  ?    Result Value  ? Group A Strep by PCR DETECTED (*)   ? All other components within normal limits  ?RESP PANEL BY RT-PCR (FLU A&B, COVID) ARPGX2  ? ? ?EKG ?None ? ?Radiology ?No results found. ? ?Procedures ?Procedures   ? ? ?Medications Ordered in ED ?Medications - No data to display ? ?ED Course/ Medical Decision Making/ A&P ?  ?                        ?Medical Decision Making ?Risk ?Prescription drug management. ? ? ?31 year old female presents to ED for evaluation of sore throat.  Patient has positive sick contact, her daughter has strep throat.  On examination, the patient has exudates noted on the left side of patient tonsils.  The patient's tonsils are swollen bilaterally 1+.  She is handling secretions appropriately, her uvula is midline.  There are no signs of PTA, RPA.  Patient largely nontoxic in appearance. ? ?Patient strep throat culture is positive.  Patient respiratory panel is negative for COVID and flu.  At this time, because of patient sore throat most likely due to strep pharyngitis.  Patient states she has penicillin allergy, she will be placed on azithromycin. ? ?At this time, the patient is stable for discharge.  I discussed the patient and her case with my attending Dr. Deretha Emory who is in agreement with the plan for management.  Patient provided with return precautions and she voiced understanding.  Patient had all of her questions answered to her satisfaction.  Patient stable. ? ?Final Clinical Impression(s) / ED Diagnoses ?Final diagnoses:  ?Strep pharyngitis  ? ? ?Rx / DC Orders ?ED Discharge Orders   ? ?      Ordered  ?  azithromycin (ZITHROMAX) 250 MG tablet  Daily       ? 07/13/21 1145  ? ?  ?  ? ?  ? ? ?  ?Al Decant, PA-C ?07/13/21 1152 ? ?  ?Vanetta Mulders, MD ?07/15/21 1821 ? ?

## 2021-07-13 NOTE — ED Triage Notes (Signed)
Pt reports she began having sore throat, head ache, fever and generalized body aches today. Family member has strep and a coworker has Covid.  ?

## 2021-07-13 NOTE — Discharge Instructions (Signed)
Return to ED with any new symptoms such as inability to swallow, drooling ?Please pick up prescription medication I sent in for you.  Please follow directions on how to take this medication ?You may use warm salt water gargles to alleviate sore throat ?Please remain hydrated, continue treating your symptoms.  If you have fevers take Tylenol, if you have body aches and chills take ibuprofen ?

## 2022-06-17 ENCOUNTER — Other Ambulatory Visit: Payer: Self-pay

## 2022-06-17 DIAGNOSIS — S61210A Laceration without foreign body of right index finger without damage to nail, initial encounter: Secondary | ICD-10-CM | POA: Diagnosis not present

## 2022-06-17 DIAGNOSIS — W260XXA Contact with knife, initial encounter: Secondary | ICD-10-CM | POA: Diagnosis not present

## 2022-06-17 DIAGNOSIS — S6991XA Unspecified injury of right wrist, hand and finger(s), initial encounter: Secondary | ICD-10-CM | POA: Diagnosis present

## 2022-06-17 NOTE — ED Triage Notes (Signed)
Arrive POV from home.   Reports using meat cleaver. Cut side of pointer finger-right hand-~2cm long. Bleeding controlled PTA with paper towel. Re-dressed with moist-nonstick dressing by this RN. Sensation intact.

## 2022-06-18 ENCOUNTER — Emergency Department (HOSPITAL_BASED_OUTPATIENT_CLINIC_OR_DEPARTMENT_OTHER)
Admission: EM | Admit: 2022-06-18 | Discharge: 2022-06-18 | Disposition: A | Discharge status: Home / Self Care | Payer: Medicaid Other | Attending: Emergency Medicine | Admitting: Emergency Medicine

## 2022-06-18 DIAGNOSIS — S61210A Laceration without foreign body of right index finger without damage to nail, initial encounter: Secondary | ICD-10-CM

## 2022-06-18 MED ORDER — LIDOCAINE HCL (PF) 1 % IJ SOLN
5.0000 mL | Freq: Once | INTRAMUSCULAR | Status: AC
  Administered 2022-06-18: 5 mL
  Filled 2022-06-18: qty 5

## 2022-06-18 MED ORDER — IBUPROFEN 400 MG PO TABS
400.0000 mg | ORAL_TABLET | Freq: Once | ORAL | Status: AC
  Administered 2022-06-18: 400 mg via ORAL
  Filled 2022-06-18: qty 1

## 2022-06-18 MED ORDER — ACETAMINOPHEN 325 MG PO TABS
650.0000 mg | ORAL_TABLET | Freq: Once | ORAL | Status: AC
  Administered 2022-06-18: 650 mg via ORAL
  Filled 2022-06-18: qty 2

## 2022-06-18 NOTE — ED Notes (Signed)
Dressing applied and pt instructed on home clean care

## 2022-06-18 NOTE — Discharge Instructions (Signed)
You were seen in the emergency department for your cut to your finger.  You had no signs of any injuries to the tendons or ligaments in your finger and your cut was repaired with 5 stitches.  These will need to be removed in 10 to 14 days and can be removed by your primary doctor, urgent care or in the ER.  You can wash your hands normally, but do not soak your hands while the stitches are in and I recommend a dressing be placed for the next 24 hours and then you can continue as needed.  You should return to the emergency department if you are having streaking redness up your arm, pus draining from her wound, you have diffuse swelling around your finger or if you have any other new or concerning symptoms.

## 2022-06-18 NOTE — ED Provider Notes (Signed)
[facility] Provider Note   CSN: [id number] Arrival date & time: 06/17/22  2209     History  Chief Complaint  Patient presents with   Finger Injury    Sandra Schultz is a 32 y.o. female.  Patient is a 32 year old female right-hand-dominant presenting to the emergency department with a finger laceration.  Patient states that she was cleaning her juicer just prior to arrival when she cut her right second finger on a blade.  She states that this was a clean knife and was not contaminated.  She denies any numbness or weakness in her finger but reports that she is having pain.  He denies any blood thinner use.  She states she is unsure when her last tetanus was last updated but reports that she does not want update today.  The history is provided by the patient.       Home Medications Prior to Admission medications   Medication Sig Start Date End Date Taking? Authorizing Provider  azithromycin (ZITHROMAX) 250 MG tablet Take 1 tablet (250 mg total) by mouth daily. Take first 2 tablets together, then 1 every day until finished. 07/13/21   Azucena Cecil, PA-C  naproxen (NAPROSYN) 500 MG tablet Take 1 tablet (500 mg total) by mouth 2 (two) times daily. 09/16/20   Raspet, Derry Skill, PA-C  estradiol cypionate (DEPO-ESTRADIOL) 5 MG/ML injection Inject into the muscle every 28 (twenty-eight) days.  06/09/19  [provider]      Allergies    Penicillins, Pomegranate extract, and Reglan [metoclopramide]    Review of Systems   Review of Systems  Physical Exam Updated Vital Signs BP (!) 160/116   Pulse 90   Temp 98.5 F (36.9 C) (Oral)   Resp 16   Ht 5\' 1"  (1.549 m)   Wt 94.3 kg   SpO2 100%   BMI 39.30 kg/m  Physical Exam Vitals and nursing note reviewed.  Constitutional:      General: She is not in acute distress.    Appearance: Normal appearance. She is obese.  HENT:     Head: Normocephalic and atraumatic.     Nose: Nose  normal.     Mouth/Throat:     Mouth: Mucous membranes are moist.     Pharynx: Oropharynx is clear.  Eyes:     Extraocular Movements: Extraocular movements intact.  Cardiovascular:     Rate and Rhythm: Normal rate.     Pulses: Normal pulses.  Pulmonary:     Effort: Pulmonary effort is normal.  Musculoskeletal:        General: Normal range of motion.     Cervical back: Normal range of motion.     Comments: Full flexion/extension of R index finger DIP/PIP/MCP, no bony tenderness  Skin:    General: Skin is warm and dry.     Capillary Refill: Capillary refill takes less than 2 seconds.     Comments: ~3 cm laceration of right index finger along proximal phalax to PIP, no active bleeding, gaping, no visible tendon or bony injury  Neurological:     General: No focal deficit present.     Mental Status: She is alert and oriented to person, place, and time.     Sensory: No sensory deficit.     Motor: No weakness.  Psychiatric:        Mood and Affect: Mood normal.        Behavior: Behavior normal.     ED Results /  Procedures / Treatments   Labs (all labs ordered are listed, but only abnormal results are displayed) Labs Reviewed - No data to display  EKG None  Radiology No results found.  Procedures .Marland KitchenLaceration Repair  Date/Time: 06/18/2022 5:02 AM  Performed by: Kemper Durie, DO Authorized by: Kemper Durie, DO   Consent:    Consent obtained:  Verbal   Consent given by:  Patient   Risks, benefits, and alternatives were discussed: yes     Risks discussed:  Infection, need for additional repair, nerve damage, pain, poor cosmetic result, poor wound healing, retained foreign body, tendon damage and vascular damage   Alternatives discussed:  No treatment and delayed treatment Universal protocol:    Procedure explained and questions answered to patient or proxy's satisfaction: yes     Patient identity confirmed:  Verbally with patient Anesthesia:    Anesthesia  method:  Nerve block   Block location:  Right index finger digital block   Block needle gauge:  27 G   Block anesthetic:  Lidocaine 1% w/o epi   Block technique:  Ring block   Block injection procedure:  Anatomic landmarks identified, anatomic landmarks palpated, introduced needle, negative aspiration for blood and incremental injection   Block outcome:  Anesthesia achieved Laceration details:    Location:  Finger   Finger location:  R index finger   Length (cm):  3   Depth (mm):  2 Exploration:    Limited defect created (wound extended): yes     Hemostasis achieved with:  Direct pressure   Wound exploration: wound explored through full range of motion and entire depth of wound visualized     Wound extent: areolar tissue not violated, fascia not violated, no foreign body, no signs of injury, no nerve damage, no tendon damage, no underlying fracture and no vascular damage     Contaminated: no   Treatment:    Area cleansed with:  Saline   Amount of cleaning:  Standard   Irrigation solution:  Sterile saline   Irrigation volume:  500 ml   Irrigation method:  Pressure wash   Visualized foreign bodies/material removed: no     Debridement:  None   Undermining:  None   Scar revision: no   Skin repair:    Repair method:  Sutures   Suture size:  4-0   Suture material:  Prolene   Suture technique:  Simple interrupted   Number of sutures:  5 Approximation:    Approximation:  Close Repair type:    Repair type:  Simple Post-procedure details:    Dressing:  Adhesive bandage   Procedure completion:  Tolerated well, no immediate complications     Medications Ordered in ED Medications  lidocaine (PF) (XYLOCAINE) 1 % injection 5 mL (5 mLs Infiltration Given 06/18/22 0408)  acetaminophen (TYLENOL) tablet 650 mg (650 mg Oral Given 06/18/22 0408)  ibuprofen (ADVIL) tablet 400 mg (400 mg Oral Given 06/18/22 0408)    ED Course/ Medical Decision Making/ A&P                             Medical  Decision Making This patient presents to the ED with chief complaint(s) of finger laceration with no pertinent past medical history which further complicates the presenting complaint. The complaint involves an extensive differential diagnosis and also carries with it a high risk of complications and morbidity.    The differential diagnosis includes laceration, no evidence of  tendinous or bony injury, no evidence of foreign body  Additional history obtained: Additional history obtained from N/A Records reviewed N/A  ED Course and Reassessment: Patient does have a finger laceration that is superficial and was cut with a kitchen knife and denies any breakage or concern for foreign body.  No foreign body was visualized and patient is no bony tenderness x-rays not required.  Laceration was repaired per procedure note.  Patient was offered tetanus prophylaxis however states that she is low concern for contamination and did not want a tetanus update today.  She was recommended primary care follow-up for wound recheck and suture removal.  She was given strict return precautions.  Independent labs interpretation:  N/A  Independent visualization of imaging: N/A  Consultation: - Consulted or discussed management/test interpretation w/ external professional: N/A  Consideration for admission or further workup: Patient has no emergent conditions requiring admission or further work-up at this time and is stable for discharge home with primary care follow-up  Social Determinants of health: N/A    Risk OTC drugs. Prescription drug management.          Final Clinical Impression(s) / ED Diagnoses Final diagnoses:  Laceration of right index finger without foreign body without damage to nail, initial encounter    Rx / DC Orders ED Discharge Orders     None         Kemper Durie, DO 06/18/22 [phone]

## 2023-06-12 ENCOUNTER — Other Ambulatory Visit: Payer: Self-pay

## 2023-06-12 ENCOUNTER — Encounter (HOSPITAL_BASED_OUTPATIENT_CLINIC_OR_DEPARTMENT_OTHER): Payer: Self-pay | Admitting: Emergency Medicine

## 2023-06-12 ENCOUNTER — Emergency Department (HOSPITAL_BASED_OUTPATIENT_CLINIC_OR_DEPARTMENT_OTHER)
Admission: EM | Admit: 2023-06-12 | Discharge: 2023-06-12 | Disposition: A | Discharge status: Home / Self Care | Payer: Medicaid Other | Attending: Emergency Medicine | Admitting: Emergency Medicine

## 2023-06-12 ENCOUNTER — Emergency Department (HOSPITAL_BASED_OUTPATIENT_CLINIC_OR_DEPARTMENT_OTHER): Payer: Medicaid Other

## 2023-06-12 DIAGNOSIS — R079 Chest pain, unspecified: Secondary | ICD-10-CM

## 2023-06-12 DIAGNOSIS — J3489 Other specified disorders of nose and nasal sinuses: Secondary | ICD-10-CM | POA: Diagnosis not present

## 2023-06-12 DIAGNOSIS — Z20822 Contact with and (suspected) exposure to covid-19: Secondary | ICD-10-CM | POA: Insufficient documentation

## 2023-06-12 DIAGNOSIS — J111 Influenza due to unidentified influenza virus with other respiratory manifestations: Secondary | ICD-10-CM | POA: Diagnosis not present

## 2023-06-12 DIAGNOSIS — R509 Fever, unspecified: Secondary | ICD-10-CM | POA: Diagnosis present

## 2023-06-12 LAB — TROPONIN I (HIGH SENSITIVITY): Troponin I (High Sensitivity): <2 ng/L (ref <18)

## 2023-06-12 LAB — CBC WITH DIFFERENTIAL/PLATELET
Abs Immature Granulocytes: 0.01 10*3/uL (ref 0.00–0.07)
Basophils Absolute: 0.1 10*3/uL (ref 0.0–0.1)
Basophils Relative: 1 %
Eosinophils Absolute: 0.2 10*3/uL (ref 0.0–0.5)
Eosinophils Relative: 3 %
HCT: 38.9 % (ref 36.0–46.0)
Hemoglobin: 13.1 g/dL (ref 12.0–15.0)
Immature Granulocytes: 0 %
Lymphocytes Relative: 39 %
Lymphs Abs: 3.1 10*3/uL (ref 0.7–4.0)
MCH: 30.3 pg (ref 26.0–34.0)
MCHC: 33.7 g/dL (ref 30.0–36.0)
MCV: 90 fL (ref 80.0–100.0)
Monocytes Absolute: 0.7 10*3/uL (ref 0.1–1.0)
Monocytes Relative: 9 %
Neutro Abs: 3.8 10*3/uL (ref 1.7–7.7)
Neutrophils Relative %: 48 %
Platelets: 304 10*3/uL (ref 150–400)
RBC: 4.32 MIL/uL (ref 3.87–5.11)
RDW: 12.1 % (ref 11.5–15.5)
WBC: 7.9 10*3/uL (ref 4.0–10.5)
nRBC: 0 % (ref 0.0–0.2)

## 2023-06-12 LAB — BASIC METABOLIC PANEL
Anion gap: 7 (ref 5–15)
BUN: 15 mg/dL (ref 6–20)
CO2: 27 mmol/L (ref 22–32)
Calcium: 9.4 mg/dL (ref 8.9–10.3)
Chloride: 103 mmol/L (ref 98–111)
Creatinine, Ser: 0.72 mg/dL (ref 0.44–1.00)
GFR, Estimated: >60 mL/min (ref >60)
Glucose, Bld: 69 mg/dL — ABNORMAL LOW (ref 70–99)
Potassium: 3.8 mmol/L (ref 3.5–5.1)
Sodium: 137 mmol/L (ref 135–145)

## 2023-06-12 LAB — RESP PANEL BY RT-PCR (RSV, FLU A&B, COVID)  RVPGX2
Influenza A by PCR: NEGATIVE
Influenza B by PCR: NEGATIVE
Resp Syncytial Virus by PCR: NEGATIVE
SARS Coronavirus 2 by RT PCR: NEGATIVE

## 2023-06-12 LAB — GROUP A STREP BY PCR: Group A Strep by PCR: NOT DETECTED

## 2023-06-12 NOTE — ED Provider Notes (Signed)
[facility] Provider Note   CSN: [id number] Arrival date & time: 06/12/23  3474     History  Chief Complaint  Patient presents with   URI    Sandra Schultz is a 33 y.o. female.  She is complaining of 2 days of headache body aches sore throat.  Today started experiencing some sharp chest pain.  Minimal cough.  No vomiting or diarrhea.  She did have sick contact with someone at work who has the flu.  No urinary symptoms.  The history is provided by the patient.  URI Presenting symptoms: congestion, fatigue, fever, rhinorrhea and sore throat   Presenting symptoms: no cough   Severity:  Moderate Onset quality:  Gradual Duration:  2 days Timing:  Constant Progression:  Unchanged Chronicity:  New Associated symptoms: headaches and myalgias        Home Medications Prior to Admission medications   Medication Sig Start Date End Date Taking? Authorizing Provider  azithromycin (ZITHROMAX) 250 MG tablet Take 1 tablet (250 mg total) by mouth daily. Take first 2 tablets together, then 1 every day until finished. 07/13/21   Al Decant, PA-C  naproxen (NAPROSYN) 500 MG tablet Take 1 tablet (500 mg total) by mouth 2 (two) times daily. 09/16/20   Raspet, Noberto Retort, PA-C  estradiol cypionate (DEPO-ESTRADIOL) 5 MG/ML injection Inject into the muscle every 28 (twenty-eight) days.  06/09/19  [provider]      Allergies    Penicillins, Pomegranate extract, and Reglan [metoclopramide]    Review of Systems   Review of Systems  Constitutional:  Positive for fatigue and fever.  HENT:  Positive for congestion, rhinorrhea and sore throat.   Respiratory:  Negative for cough.   Cardiovascular:  Positive for chest pain.  Gastrointestinal:  Negative for abdominal pain.  Musculoskeletal:  Positive for myalgias.  Neurological:  Positive for headaches.    Physical Exam Updated Vital Signs BP 123/89 (BP Location: Left Arm)   Pulse 66    Temp 98.4 F (36.9 C) (Oral)   Resp 19   Ht 5\' 1"  (1.549 m)   Wt 79.4 kg   LMP 06/10/2023   SpO2 99%   BMI 33.07 kg/m  Physical Exam Vitals and nursing note reviewed.  Constitutional:      General: She is not in acute distress.    Appearance: Normal appearance. She is well-developed.  HENT:     Head: Normocephalic and atraumatic.  Eyes:     Conjunctiva/sclera: Conjunctivae normal.  Cardiovascular:     Rate and Rhythm: Normal rate and regular rhythm.     Heart sounds: No murmur heard. Pulmonary:     Effort: Pulmonary effort is normal. No respiratory distress.     Breath sounds: Normal breath sounds.  Abdominal:     Palpations: Abdomen is soft.     Tenderness: There is no abdominal tenderness. There is no guarding or rebound.  Musculoskeletal:        General: No deformity.     Cervical back: Neck supple.  Skin:    General: Skin is warm and dry.     Capillary Refill: Capillary refill takes less than 2 seconds.  Neurological:     General: No focal deficit present.     Mental Status: She is alert.     ED Results / Procedures / Treatments   Labs (all labs ordered are listed, but only abnormal results are displayed) Labs Reviewed  BASIC METABOLIC PANEL - Abnormal;  Notable for the following components:      Result Value   Glucose, Bld 69 (*)    All other components within normal limits  RESP PANEL BY RT-PCR (RSV, FLU A&B, COVID)  [id number]  GROUP A STREP BY PCR  CBC WITH DIFFERENTIAL/PLATELET  TROPONIN I (HIGH SENSITIVITY)  TROPONIN I (HIGH SENSITIVITY)    EKG EKG Interpretation Date/Time:  Wednesday June 12 2023 09:39:38 EST Ventricular Rate:  69 PR Interval:  121 QRS Duration:  96 QT Interval:  399 QTC Calculation: 428 R Axis:   93  Text Interpretation: Sinus rhythm Borderline right axis deviation No significant change since prior 11/20 Confirmed by Meridee Score [phone]) on 06/12/2023 9:42:08 AM  Radiology DG Chest Port 1 View Result Date:  06/12/2023 CLINICAL DATA:  Chest pain, sore throat EXAM: PORTABLE CHEST 1 VIEW COMPARISON:  10/14/2019 FINDINGS: The heart size and mediastinal contours are within normal limits. Both lungs are clear. The visualized skeletal structures are unremarkable except for similar midthoracic dextroscoliosis. Trachea midline. IMPRESSION: No active disease. Electronically Signed   By: Judie Petit.  Shick M.D.   On: 06/12/2023 11:08    Procedures Procedures    Medications Ordered in ED Medications - No data to display  ED Course/ Medical Decision Making/ A&P                                 Medical Decision Making Amount and/or Complexity of Data Reviewed Labs: ordered. Radiology: ordered.   This patient complains of sore throat fever headache body aches cough; this involves an extensive number of treatment Options and is a complaint that carries with it a high risk of complications and morbidity. The differential includes flu, COVID, pneumonia, viral syndrome, dehydration  I ordered, reviewed and interpreted labs, which included COVID and flu negative, CBC normal, chemistries with mildly low glucose, troponins flat, strep negative I ordered imaging studies which included chest x-ray and I independently    visualized and interpreted imaging which showed no acute findings Previous records obtained and reviewed in epic no recent visits Cardiac monitoring reviewed, normal sinus rhythm Social determinants considered, no significant barriers Critical Interventions: None  After the interventions stated above, I reevaluated the patient and found patient to be hemodynamically stable in no distress Admission and further testing considered, no indications for admission at this time.  Recommended symptomatic treatment and outpatient follow-up with PCP.  Return instructions discussed         Final Clinical Impression(s) / ED Diagnoses Final diagnoses:  Influenza-like illness  Nonspecific chest pain     Rx / DC Orders ED Discharge Orders     None         Terrilee Files, MD 06/12/23 1732

## 2023-06-12 NOTE — ED Notes (Signed)
IV Removed

## 2023-06-12 NOTE — ED Triage Notes (Signed)
Sore throat, fever, headache, body aches, sneezing, coughing x 2 days.  Pt c/o chest pain today.  No acute distress noted.

## 2023-09-05 ENCOUNTER — Other Ambulatory Visit (HOSPITAL_BASED_OUTPATIENT_CLINIC_OR_DEPARTMENT_OTHER): Payer: Self-pay

## 2023-09-05 MED ORDER — WEGOVY 0.5 MG/0.5ML ~~LOC~~ SOAJ
0.5000 mg | SUBCUTANEOUS | 0 refills | Status: AC
  Filled 2023-09-05: qty 2, 28d supply, fill #0

## 2023-09-05 MED ORDER — LISDEXAMFETAMINE DIMESYLATE 50 MG PO CAPS
50.0000 mg | ORAL_CAPSULE | Freq: Every day | ORAL | 0 refills | Status: DC
  Filled 2023-09-05: qty 30, 30d supply, fill #0

## 2023-09-06 ENCOUNTER — Other Ambulatory Visit (HOSPITAL_BASED_OUTPATIENT_CLINIC_OR_DEPARTMENT_OTHER): Payer: Self-pay

## 2023-10-03 ENCOUNTER — Other Ambulatory Visit (HOSPITAL_BASED_OUTPATIENT_CLINIC_OR_DEPARTMENT_OTHER): Payer: Self-pay

## 2023-10-03 ENCOUNTER — Other Ambulatory Visit: Payer: Self-pay

## 2023-10-03 MED ORDER — LISDEXAMFETAMINE DIMESYLATE 50 MG PO CAPS
50.0000 mg | ORAL_CAPSULE | Freq: Every day | ORAL | 0 refills | Status: AC
  Filled 2023-10-03 – 2023-10-10 (×2): qty 30, 30d supply, fill #0

## 2023-10-03 MED ORDER — WEGOVY 1 MG/0.5ML ~~LOC~~ SOAJ
1.0000 mg | SUBCUTANEOUS | 0 refills | Status: AC
  Filled 2023-10-03: qty 2, 28d supply, fill #0

## 2023-10-09 ENCOUNTER — Encounter: Payer: Self-pay | Admitting: Obstetrics and Gynecology

## 2023-10-09 ENCOUNTER — Other Ambulatory Visit (HOSPITAL_BASED_OUTPATIENT_CLINIC_OR_DEPARTMENT_OTHER): Payer: Self-pay

## 2023-10-09 ENCOUNTER — Other Ambulatory Visit (HOSPITAL_COMMUNITY)
Admission: RE | Admit: 2023-10-09 | Discharge: 2023-10-09 | Disposition: A | Discharge status: Home / Self Care | Source: Ambulatory Visit | Attending: Obstetrics and Gynecology | Admitting: Obstetrics and Gynecology

## 2023-10-09 ENCOUNTER — Ambulatory Visit: Admitting: Obstetrics and Gynecology

## 2023-10-09 VITALS — BP 118/77 | HR 78 | Ht 61.0 in | Wt 187.0 lb

## 2023-10-09 DIAGNOSIS — Z01419 Encounter for gynecological examination (general) (routine) without abnormal findings: Secondary | ICD-10-CM

## 2023-10-09 DIAGNOSIS — Z124 Encounter for screening for malignant neoplasm of cervix: Secondary | ICD-10-CM | POA: Insufficient documentation

## 2023-10-09 DIAGNOSIS — Z113 Encounter for screening for infections with a predominantly sexual mode of transmission: Secondary | ICD-10-CM | POA: Insufficient documentation

## 2023-10-09 NOTE — Progress Notes (Signed)
 Pt presents for new gyn. Pt has no questions or concerns at this time.

## 2023-10-09 NOTE — Progress Notes (Signed)
 ANNUAL EXAM Patient name: Sandra Schultz MRN [id number]  Date of birth: 26-Feb-1991 Chief Complaint:   New Patient (Initial Visit) and Annual Exam  History of Present Illness:   Sandra Schultz is a 33 y.o. [phone]  female being seen today for a routine annual exam.  Current complaints: establishing care today. Desires STD testing. Reports regular cycles, not sexually active.  Patient's last menstrual period was 09/30/2023 (exact date).   The pregnancy intention screening data noted above was reviewed. Potential methods of contraception were discussed. The patient elected to proceed with No data recorded.   Last pap 2017. Results were: neg. H/O abnormal pap: no Last mammogram: n/a. Results were: N/A. Family h/o breast cancer: no Last colonoscopy: n/a. Results were: N/A. Family h/o colorectal cancer: no     07/27/2020   11:18 AM 07/25/2020    9:08 AM 12/23/2014    9:29 AM  Depression screen PHQ 2/9  Decreased Interest 0 0 0  Down, Depressed, Hopeless 0 0 0  PHQ - 2 Score 0 0 0  Tired, decreased energy  0   Change in appetite  0   Feeling bad or failure about yourself   0   Trouble concentrating  0   Moving slowly or fidgety/restless  0   Suicidal thoughts  0         07/25/2020    9:07 AM  GAD 7 : Generalized Anxiety Score  Nervous, Anxious, on Edge 2  Control/stop worrying 2  Worry too much - different things 2  Trouble relaxing 2  Restless 2  Easily annoyed or irritable 2  Afraid - awful might happen 2  Total GAD 7 Score 14     Review of Systems:   Pertinent items are noted in HPI Denies any headaches, blurred vision, fatigue, shortness of breath, chest pain, abdominal pain, abnormal vaginal discharge/itching/odor/irritation, problems with periods, bowel movements, urination, or intercourse unless otherwise stated above. Pertinent History Reviewed:  Reviewed past medical,surgical, social and family history.  Reviewed problem list, medications and  allergies. Physical Assessment:   Vitals:   10/09/23 0922  BP: 118/77  Pulse: 78  Weight: 187 lb (84.8 kg)  Height: 5\' 1"  (1.549 m)  Body mass index is 35.33 kg/m.        Physical Examination:   General appearance - well appearing, and in no distress  Mental status - alert, oriented   Psych:  She has a normal mood and affect  Skin - warm and dry, normal color  Chest - effort normal, all lung fields clear to auscultation bilaterally  Heart - normal rate and regular rhythm  Neck:  midline trachea, no thyromegaly or nodules  Breasts - breasts appear normal, no suspicious masses, no skin or nipple changes or  axillary nodes  Abdomen - soft, nontender  Pelvic - VULVA: normal appearing vulva with no masses, tenderness or lesions  VAGINA: normal appearing vagina with normal color and discharge, no lesions  CERVIX: normal appearing cervix without discharge or lesions, no CMT  Thin prep pap is done w HR HPV cotesting  UTERUS: uterus is felt to be normal size, shape, consistency and nontender   ADNEXA: No adnexal masses or tenderness noted  Extremities:  No swelling or varicosities noted  Chaperone present for exam  No results found for this or any previous visit (from the past 24 hours).  Assessment & Plan:  1. Encounter for annual routine gynecological examination (Primary) Pap today Encouraged routine self breast exams  Following with PCP  - Cytology - PAP( [place])  2. Cervical cancer screening  - Cytology - PAP( [place])  3. Screen for STD (sexually transmitted disease)  - Cervicovaginal ancillary only( [place]) - RPR - HIV antibody (with reflex) - Hepatitis C Antibody - Hepatitis B Surface AntiGEN   Labs/procedures today:   Mammogram: @ 32yo, or sooner if problems Colonoscopy: @ 32yo, or sooner if problems  Orders Placed This Encounter  Procedures   RPR   HIV antibody (with reflex)   Hepatitis C Antibody   Hepatitis B Surface AntiGEN    Meds: No  orders of the defined types were placed in this encounter.   Follow-up: Return in about 1 year (around 10/08/2024) for Zoanne Hinders, FNP

## 2023-10-10 ENCOUNTER — Ambulatory Visit: Payer: Self-pay | Admitting: Obstetrics and Gynecology

## 2023-10-10 ENCOUNTER — Other Ambulatory Visit (HOSPITAL_BASED_OUTPATIENT_CLINIC_OR_DEPARTMENT_OTHER): Payer: Self-pay

## 2023-10-10 LAB — CERVICOVAGINAL ANCILLARY ONLY
Bacterial Vaginitis (gardnerella): POSITIVE — AB
Candida Glabrata: NEGATIVE
Candida Vaginitis: NEGATIVE
Chlamydia: NEGATIVE
Comment: NEGATIVE
Comment: NEGATIVE
Comment: NEGATIVE
Comment: NEGATIVE
Comment: NEGATIVE
Comment: NORMAL
Neisseria Gonorrhea: NEGATIVE
Trichomonas: NEGATIVE

## 2023-10-10 LAB — HIV ANTIBODY (ROUTINE TESTING W REFLEX): HIV Screen 4th Generation wRfx: NONREACTIVE

## 2023-10-10 LAB — RPR: RPR Ser Ql: NONREACTIVE

## 2023-10-10 LAB — HEPATITIS C ANTIBODY: Hep C Virus Ab: NONREACTIVE

## 2023-10-10 LAB — HEPATITIS B SURFACE ANTIGEN: Hepatitis B Surface Ag: NEGATIVE

## 2023-10-14 ENCOUNTER — Other Ambulatory Visit (HOSPITAL_BASED_OUTPATIENT_CLINIC_OR_DEPARTMENT_OTHER): Payer: Self-pay

## 2023-10-14 ENCOUNTER — Other Ambulatory Visit: Payer: Self-pay

## 2023-10-14 DIAGNOSIS — B9689 Other specified bacterial agents as the cause of diseases classified elsewhere: Secondary | ICD-10-CM

## 2023-10-14 DIAGNOSIS — B379 Candidiasis, unspecified: Secondary | ICD-10-CM

## 2023-10-14 MED ORDER — FLUCONAZOLE 150 MG PO TABS
150.0000 mg | ORAL_TABLET | Freq: Once | ORAL | 0 refills | Status: AC
  Filled 2023-10-14: qty 1, 1d supply, fill #0

## 2023-10-14 MED ORDER — METRONIDAZOLE 500 MG PO TABS
500.0000 mg | ORAL_TABLET | Freq: Two times a day (BID) | ORAL | 0 refills | Status: AC
  Filled 2023-10-14: qty 14, 7d supply, fill #0

## 2023-10-18 LAB — CYTOLOGY - PAP
Comment: NEGATIVE
Comment: NEGATIVE
Diagnosis: UNDETERMINED — AB
HPV 16: NEGATIVE
HPV 18 / 45: NEGATIVE
High risk HPV: POSITIVE — AB

## 2023-12-05 ENCOUNTER — Other Ambulatory Visit (HOSPITAL_BASED_OUTPATIENT_CLINIC_OR_DEPARTMENT_OTHER): Payer: Self-pay

## 2023-12-05 MED ORDER — WEGOVY 0.25 MG/0.5ML ~~LOC~~ SOAJ
0.2500 mg | SUBCUTANEOUS | 0 refills | Status: DC
  Filled 2023-12-05: qty 2, 28d supply, fill #0

## 2023-12-05 MED ORDER — LISDEXAMFETAMINE DIMESYLATE 40 MG PO CAPS
40.0000 mg | ORAL_CAPSULE | Freq: Every day | ORAL | 0 refills | Status: DC
  Filled 2023-12-05: qty 30, 30d supply, fill #0

## 2024-02-18 ENCOUNTER — Other Ambulatory Visit (HOSPITAL_BASED_OUTPATIENT_CLINIC_OR_DEPARTMENT_OTHER): Payer: Self-pay

## 2024-02-18 MED ORDER — WEGOVY 0.25 MG/0.5ML ~~LOC~~ SOAJ
0.2500 mg | SUBCUTANEOUS | 0 refills | Status: AC
  Filled 2024-02-18: qty 2, 28d supply, fill #0

## 2024-02-18 MED ORDER — TRUE VITAMIN D3 1.25 MG (50000 UT) PO CAPS
50000.0000 [IU] | ORAL_CAPSULE | ORAL | 2 refills | Status: AC
  Filled 2024-02-18: qty 12, 84d supply, fill #0

## 2024-02-18 MED ORDER — LISDEXAMFETAMINE DIMESYLATE 40 MG PO CAPS
40.0000 mg | ORAL_CAPSULE | Freq: Every day | ORAL | 0 refills | Status: AC
  Filled 2024-02-18: qty 30, 30d supply, fill #0

## 2024-02-19 ENCOUNTER — Other Ambulatory Visit (HOSPITAL_BASED_OUTPATIENT_CLINIC_OR_DEPARTMENT_OTHER): Payer: Self-pay

## 2024-02-20 ENCOUNTER — Other Ambulatory Visit (HOSPITAL_BASED_OUTPATIENT_CLINIC_OR_DEPARTMENT_OTHER): Payer: Self-pay

## 2024-02-21 ENCOUNTER — Other Ambulatory Visit (HOSPITAL_BASED_OUTPATIENT_CLINIC_OR_DEPARTMENT_OTHER): Payer: Self-pay

## 2024-04-05 ENCOUNTER — Encounter (HOSPITAL_BASED_OUTPATIENT_CLINIC_OR_DEPARTMENT_OTHER): Payer: Self-pay

## 2024-04-05 ENCOUNTER — Other Ambulatory Visit: Payer: Self-pay

## 2024-04-05 ENCOUNTER — Emergency Department (HOSPITAL_BASED_OUTPATIENT_CLINIC_OR_DEPARTMENT_OTHER)

## 2024-04-05 ENCOUNTER — Emergency Department (HOSPITAL_BASED_OUTPATIENT_CLINIC_OR_DEPARTMENT_OTHER)
Admission: EM | Admit: 2024-04-05 | Discharge: 2024-04-05 | Disposition: A | Discharge status: Home / Self Care | Attending: Emergency Medicine | Admitting: Emergency Medicine

## 2024-04-05 DIAGNOSIS — J40 Bronchitis, not specified as acute or chronic: Secondary | ICD-10-CM | POA: Diagnosis not present

## 2024-04-05 DIAGNOSIS — R059 Cough, unspecified: Secondary | ICD-10-CM | POA: Diagnosis present

## 2024-04-05 LAB — RESP PANEL BY RT-PCR (RSV, FLU A&B, COVID)  RVPGX2
Influenza A by PCR: NEGATIVE
Influenza B by PCR: NEGATIVE
Resp Syncytial Virus by PCR: NEGATIVE
SARS Coronavirus 2 by RT PCR: NEGATIVE

## 2024-04-05 LAB — GROUP A STREP BY PCR: Group A Strep by PCR: NOT DETECTED

## 2024-04-05 MED ORDER — BENZONATATE 100 MG PO CAPS: 100.0000 mg | ORAL_CAPSULE | Freq: Three times a day (TID) | ORAL | 0 refills | Status: AC

## 2024-04-05 NOTE — ED Provider Notes (Signed)
 [place] [facility]MENT AT [facility] Provider Note   CSN: [id number] Arrival date & time: 04/05/24  2011     Patient presents with: Shortness of Breath and Sore Throat   Sandra Schultz is a 33 y.o. female.   Patient is a 33 year old female with a history of GERD, headaches and depression who presents with upper respiratory symptoms.  She said it started a week ago.  She has had some congestion and coughing and sore throat.  She is also initially had some myalgias although she said those have improved.  She has had subjective fevers.  She did go to a doctor's office for a visit midweek and said her temperature at that time was 101 but she does not have a thermometer to check it at home.  She had a negative COVID and flu swab as well as strep test in the doctor's office but she said that she had just drink a lot of tea and thinks that that interfered with the results.  She denies any vomiting or diarrhea.  She feels like when she is talking she has to periodically stop and take a deep breath which makes her feel like she is short of breath but she does not report any other symptoms with ambulation.  No urinary symptoms.       Prior to Admission medications   Medication Sig Start Date End Date Taking? Authorizing Provider  benzonatate  (TESSALON ) 100 MG capsule Take 1 capsule (100 mg total) by mouth every 8 (eight) hours. 04/05/24  Yes Lenor Hollering, MD  Cholecalciferol  (TRUE VITAMIN D3) 1.25 MG (50000 UT) capsule Take 1 capsule (50,000 Units total) by mouth once a week. 02/18/24     lisdexamfetamine (VYVANSE ) 40 MG capsule Take 1 capsule (40 mg total) by mouth daily. 02/18/24     lisdexamfetamine (VYVANSE ) 50 MG capsule Take 1 capsule (50 mg total) by mouth daily. 10/03/23     metroNIDAZOLE  (FLAGYL ) 500 MG tablet Take 1 tablet (500 mg total) by mouth 2 (two) times daily. 10/14/23   Delores Nidia CROME, FNP  semaglutide -weight management (WEGOVY ) 0.25 MG/0.5ML SOAJ SQ  injection Inject 0.25 mg into the skin once a week. 02/18/24     Semaglutide -Weight Management (WEGOVY ) 0.5 MG/0.5ML SOAJ Inject 0.5 mg into the skin once a week. Patient not taking: Reported on 10/09/2023 09/05/23     Semaglutide -Weight Management (WEGOVY ) 1 MG/0.5ML SOAJ Inject 1 mg into the skin every 7 (seven) days. 10/03/23     estradiol  cypionate (DEPO-ESTRADIOL ) 5 MG/ML injection Inject into the muscle every 28 (twenty-eight) days.  06/09/19  [provider]    Allergies: Penicillins, Pomegranate extract, and Reglan  [metoclopramide ]    Review of Systems  Constitutional:  Positive for fatigue and fever. Negative for chills and diaphoresis.  HENT:  Positive for congestion, sore throat and voice change (Some hoarseness). Negative for sneezing and trouble swallowing.   Eyes: Negative.   Respiratory:  Positive for cough and shortness of breath. Negative for chest tightness.   Cardiovascular:  Negative for chest pain and leg swelling.  Gastrointestinal:  Negative for abdominal pain, diarrhea, nausea and vomiting.  Genitourinary:  Negative for difficulty urinating, flank pain and frequency.  Musculoskeletal:  Positive for myalgias. Negative for arthralgias and back pain.  Skin:  Negative for rash.  Neurological:  Negative for dizziness, weakness, numbness and headaches.    Updated Vital Signs BP 127/84   Pulse 85   Temp 99.1 F (37.3 C) (Oral)   Resp 20  Ht 5' 1 (1.549 m)   Wt 97.5 kg   SpO2 99%   BMI 40.62 kg/m   Physical Exam Constitutional:      Appearance: She is well-developed.  HENT:     Head: Normocephalic and atraumatic.     Right Ear: Tympanic membrane normal.     Left Ear: Tympanic membrane normal.     Mouth/Throat:     Pharynx: No pharyngeal swelling or oropharyngeal exudate.     Comments: No erythema or exudates, uvula is midline, no peritonsillar fullness, no trismus, no elevation of the tongue Eyes:     Pupils: Pupils are equal, round, and reactive to  light.  Cardiovascular:     Rate and Rhythm: Normal rate and regular rhythm.     Heart sounds: Normal heart sounds.  Pulmonary:     Effort: Pulmonary effort is normal. No respiratory distress.     Breath sounds: Normal breath sounds. No wheezing or rales.  Chest:     Chest wall: No tenderness.  Abdominal:     General: Bowel sounds are normal.     Palpations: Abdomen is soft.     Tenderness: There is no abdominal tenderness. There is no guarding or rebound.  Musculoskeletal:        General: Normal range of motion.     Cervical back: Normal range of motion and neck supple.  Lymphadenopathy:     Cervical: No cervical adenopathy.  Skin:    General: Skin is warm and dry.     Findings: No rash.  Neurological:     Mental Status: She is alert and oriented to person, place, and time.     (all labs ordered are listed, but only abnormal results are displayed) Labs Reviewed  RESP PANEL BY RT-PCR (RSV, FLU A&B, COVID)  [id number]  GROUP A STREP BY PCR    EKG: None  Radiology: DG Chest 2 View Result Date: 04/05/2024 EXAM: 2 VIEW(S) XRAY OF THE CHEST 04/05/2024 08:35:00 PM COMPARISON: Comparison with 06/12/2023. CLINICAL HISTORY: shob shob FINDINGS: LUNGS AND PLEURA: No focal pulmonary opacity. No pleural effusion. No pneumothorax. HEART AND MEDIASTINUM: No acute abnormality of the cardiac and mediastinal silhouettes. BONES AND SOFT TISSUES: Thoracic scoliosis convex towards the right. IMPRESSION: 1. No acute cardiopulmonary process to explain shortness of breath. 2. Thoracic dextroscoliosis. Electronically signed by: Elsie Gravely MD 04/05/2024 08:42 PM EST RP Workstation: [id number]     Procedures   Medications Ordered in the ED - No data to display                                  Medical Decision Making Amount and/or Complexity of Data Reviewed Radiology: ordered.  Risk Prescription drug management.   This patient presents to the ED for concern of cough, sore throat, this  involves an extensive number of treatment options, and is a complaint that carries with it a high risk of complications and morbidity.  I considered the following differential and admission for this acute, potentially life threatening condition.  The differential diagnosis includes viral URI, influenza, strep throat, peritonsillar abscess, mono, pneumonia  MDM:    Patient is a 33 year old who presents with upper respiratory symptoms.  She has had subjective fevers.  She has had feeling of shortness of breath at times when she is talking but not really exertional symptoms.  Her lungs are clear on exam.  There is no wheezing.  Chest x-ray does  not show any evidence of pneumonia.  Her oxygen saturation is 99% on room air.  Her COVID/flu test is negative.  RSV is negative.  Strep test is negative.  She is overall well-appearing.  I suspect that she has a viral upper respiratory infection.  She was discharged home in good condition.  She was given prescription for Tessalon  Perles.  She was advised on symptomatic care.  She was encouraged to follow-up with her PCP if her symptoms are improving.  Return precautions were given.  (Labs, imaging, consults)  Labs: I Ordered, and personally interpreted labs.  The pertinent results include: Respiratory viral panel, strep test  Imaging Studies ordered: I ordered imaging studies including chest x-ray I independently visualized and interpreted imaging. I agree with the radiologist interpretation  Additional history obtained from chart.  External records from outside source obtained and reviewed including prior notes  Cardiac Monitoring: The patient was maintained on a cardiac monitor.  If on the cardiac monitor, I personally viewed and interpreted the cardiac monitored which showed an underlying rhythm of: Sinus rhythm  Reevaluation: After the interventions noted above, I reevaluated the patient and found that they have :stayed the same  Social Determinants  of Health:    Disposition: Discharged to home  Co morbidities that complicate the patient evaluation  Past Medical History:  Diagnosis Date   Depression 2011   postpartum, after first child, not after second child   GERD (gastroesophageal reflux disease)    Tums    Headache    Extra Strength Tylenol  helps   Seasonal allergies      Medicines Meds ordered this encounter  Medications   benzonatate  (TESSALON ) 100 MG capsule    Sig: Take 1 capsule (100 mg total) by mouth every 8 (eight) hours.    Dispense:  21 capsule    Refill:  0    I have reviewed the patients home medicines and have made adjustments as needed  Problem List / ED Course: Problem List Items Addressed This Visit   None Visit Diagnoses       Bronchitis    -  Primary                Final diagnoses:  Bronchitis    ED Discharge Orders          Ordered    benzonatate  (TESSALON ) 100 MG capsule  Every 8 hours        04/05/24 2141               Lenor Hollering, MD 04/05/24 2158

## 2024-04-05 NOTE — ED Triage Notes (Signed)
 Pt states that she has had flu like sxs since last week. Now feels [facility] and like her tonsils are swollen.

## 2024-04-05 NOTE — Discharge Instructions (Signed)
 Make an appointment to follow-up with your primary care doctor if your symptoms are not improving.  Return to the emergency room if you have any worsening symptoms.

## 2024-05-07 ENCOUNTER — Other Ambulatory Visit (HOSPITAL_BASED_OUTPATIENT_CLINIC_OR_DEPARTMENT_OTHER): Payer: Self-pay

## 2024-05-20 ENCOUNTER — Encounter (HOSPITAL_BASED_OUTPATIENT_CLINIC_OR_DEPARTMENT_OTHER): Payer: Self-pay

## 2024-05-20 ENCOUNTER — Other Ambulatory Visit (HOSPITAL_BASED_OUTPATIENT_CLINIC_OR_DEPARTMENT_OTHER): Payer: Self-pay

## 2024-06-19 ENCOUNTER — Emergency Department (HOSPITAL_BASED_OUTPATIENT_CLINIC_OR_DEPARTMENT_OTHER): Admission: EM | Admit: 2024-06-19 | Source: Home / Self Care

## 2024-06-19 ENCOUNTER — Other Ambulatory Visit: Payer: Self-pay

## 2024-06-19 LAB — URINALYSIS, ROUTINE W REFLEX MICROSCOPIC
Bilirubin Urine: NEGATIVE
Glucose, UA: NEGATIVE mg/dL
Ketones, ur: NEGATIVE mg/dL
Leukocytes,Ua: NEGATIVE
Nitrite: NEGATIVE
Protein, ur: NEGATIVE mg/dL
Specific Gravity, Urine: 1.025 (ref 1.005–1.030)
pH: 6.5 (ref 5.0–8.0)

## 2024-06-19 LAB — CBC WITH DIFFERENTIAL/PLATELET
Abs Immature Granulocytes: 0.04 10*3/uL (ref 0.00–0.07)
Basophils Absolute: 0 10*3/uL (ref 0.0–0.1)
Basophils Relative: 0 %
Eosinophils Absolute: 0.3 10*3/uL (ref 0.0–0.5)
Eosinophils Relative: 3 %
HCT: 38.3 % (ref 36.0–46.0)
Hemoglobin: 13.2 g/dL (ref 12.0–15.0)
Immature Granulocytes: 0 %
Lymphocytes Relative: 35 %
Lymphs Abs: 3.8 10*3/uL (ref 0.7–4.0)
MCH: 29.4 pg (ref 26.0–34.0)
MCHC: 34.5 g/dL (ref 30.0–36.0)
MCV: 85.3 fL (ref 80.0–100.0)
Monocytes Absolute: 0.9 10*3/uL (ref 0.1–1.0)
Monocytes Relative: 9 %
Neutro Abs: 5.8 10*3/uL (ref 1.7–7.7)
Neutrophils Relative %: 53 %
Platelets: 383 10*3/uL (ref 150–400)
RBC: 4.49 MIL/uL (ref 3.87–5.11)
RDW: 12.1 % (ref 11.5–15.5)
WBC: 10.9 10*3/uL — ABNORMAL HIGH (ref 4.0–10.5)
nRBC: 0 % (ref 0.0–0.2)

## 2024-06-19 LAB — BASIC METABOLIC PANEL WITH GFR
Anion gap: 13 (ref 5–15)
BUN: 18 mg/dL (ref 6–20)
CO2: 22 mmol/L (ref 22–32)
Calcium: 9.5 mg/dL (ref 8.9–10.3)
Chloride: 102 mmol/L (ref 98–111)
Creatinine, Ser: 0.76 mg/dL (ref 0.44–1.00)
GFR, Estimated: >60 mL/min
Glucose, Bld: 127 mg/dL — ABNORMAL HIGH (ref 70–99)
Potassium: 3.8 mmol/L (ref 3.5–5.1)
Sodium: 137 mmol/L (ref 135–145)

## 2024-06-19 LAB — HEPATIC FUNCTION PANEL
ALT: 27 U/L (ref 0–44)
AST: 30 U/L (ref 15–41)
Albumin: 4.3 g/dL (ref 3.5–5.0)
Alkaline Phosphatase: 113 U/L (ref 38–126)
Bilirubin, Direct: 0.1 mg/dL (ref 0.0–0.2)
Indirect Bilirubin: 0.1 mg/dL — ABNORMAL LOW (ref 0.3–0.9)
Total Bilirubin: 0.2 mg/dL (ref 0.0–1.2)
Total Protein: 7.3 g/dL (ref 6.5–8.1)

## 2024-06-19 LAB — PREGNANCY, URINE: Preg Test, Ur: NEGATIVE

## 2024-06-19 LAB — URINALYSIS, MICROSCOPIC (REFLEX): WBC, UA: NONE SEEN WBC/hpf (ref 0–5)

## 2024-06-19 MED ORDER — LIDOCAINE 5 % EX PTCH
1.0000 | MEDICATED_PATCH | CUTANEOUS | Status: AC
  Administered 2024-06-19: 1 via TRANSDERMAL
  Filled 2024-06-19: qty 1

## 2024-06-19 MED ORDER — IBUPROFEN 800 MG PO TABS
800.0000 mg | ORAL_TABLET | Freq: Once | ORAL | Status: AC
  Administered 2024-06-19: 800 mg via ORAL
  Filled 2024-06-19: qty 1

## 2024-06-19 NOTE — ED Triage Notes (Addendum)
 Pt arrives POV with complaints of itching on her feet and hands x 3 days. No rash// no change in lifestyle// no new allergies noted. Complains of right sided back pain but denies injury. Pt also complains of weight gain over the last couple months due to medication changes. Ambulatory to triage, resp eu.

## 2024-06-19 NOTE — ED Notes (Signed)
 Patient transferred from waiting room to ED treatment room. Assuming pt care at this time.

## 2024-06-19 NOTE — ED Notes (Signed)
 Educated patient on proper procedure for obtaining a clean catch urine specimen. Patient verbalizes understanding. Patient aware of necessity of urine sample.

## 2024-06-19 NOTE — ED Provider Notes (Incomplete)
 " [place] EM[facility]ENCY [facility] AT [facility] Provider Note   CSN: [id number] Arrival date & time: 06/19/24  1914     Patient presents with: multiple complaints   Sandra Schultz is a 34 y.o. female who presents emergency department for chief complaint of bilateral hand and foot itching for the last 3 days.  Patient denies any rashes or redness.  Does not appreciate any new soaps or detergents.  No new known allergies.  Also appreciates right sided atraumatic back pain also going on the last 2 to 3 days.  Patient also concerned about weight gain since September.  Patient was previously on Vyvanse  as well as Wegovy .  Patient subsequently stopped Vyvanse  and stopped Wegovy  as well as her insurance stopped approving the medication.  Patient reports that she has gained approximately 40 pounds going from 180 pounds to 220 pounds since September.  Patient denies groin numbness/tingling, urinary/bowel incontinence or retention, or issues walking.  Patient does report bilateral foot pain whenever bearing weight.  Past medical history significant for GERD, seasonal allergies, depression, obesity, etc. patient reports no prescription medications at home currently, only takes vitamins.  {Add pertinent medical, surgical, social history, OB history to HPI:32947} HPI     Prior to Admission medications  Medication Sig Start Date End Date Taking? Authorizing Provider  benzonatate  (TESSALON ) 100 MG capsule Take 1 capsule (100 mg total) by mouth every 8 (eight) hours. 04/05/24   Lenor Hollering, MD  Cholecalciferol  (TRUE VITAMIN D3) 1.25 MG (50000 UT) capsule Take 1 capsule (50,000 Units total) by mouth once a week. 02/18/24     lisdexamfetamine  (VYVANSE ) 40 MG capsule Take 1 capsule (40 mg total) by mouth daily. 02/18/24     lisdexamfetamine  (VYVANSE ) 50 MG capsule Take 1 capsule (50 mg total) by mouth daily. 10/03/23     metroNIDAZOLE  (FLAGYL ) 500 MG tablet Take 1 tablet (500 mg total) by mouth 2  (two) times daily. 10/14/23   Delores Nidia CROME, FNP  semaglutide -weight management (WEGOVY ) 0.25 MG/0.5ML SOAJ SQ injection Inject 0.25 mg into the skin once a week. 02/18/24     Semaglutide -Weight Management (WEGOVY ) 0.5 MG/0.5ML SOAJ Inject 0.5 mg into the skin once a week. Patient not taking: Reported on 10/09/2023 09/05/23     Semaglutide -Weight Management (WEGOVY ) 1 MG/0.5ML SOAJ Inject 1 mg into the skin every 7 (seven) days. 10/03/23     estradiol  cypionate (DEPO-ESTRADIOL ) 5 MG/ML injection Inject into the muscle every 28 (twenty-eight) days.  06/09/19  [provider]    Allergies: Penicillins, Pomegranate extract, and Reglan  [metoclopramide ]    Review of Systems  Musculoskeletal:  Positive for back pain.    Updated Vital Signs BP 121/86 (BP Location: Right Arm)   Pulse 94   Temp 97.9 F (36.6 C) (Oral)   Resp 16   Ht 5' 1 (1.549 m)   Wt 106.6 kg   LMP 06/18/2024 (Approximate)   SpO2 99%   BMI 44.40 kg/m   Physical Exam Vitals and nursing note reviewed.  Constitutional:      General: She is awake. She is not in acute distress.    Appearance: Normal appearance. She is not ill-appearing, toxic-appearing or diaphoretic.  HENT:     Head: Normocephalic and atraumatic.  Eyes:     General: No scleral icterus. Cardiovascular:     Rate and Rhythm: Normal rate and regular rhythm.  Pulmonary:     Effort: Pulmonary effort is normal. No respiratory distress.     Breath sounds: Normal  breath sounds. No stridor. No wheezing, rhonchi or rales.  Abdominal:     General: Abdomen is flat. There is no distension.     Palpations: Abdomen is soft.     Tenderness: There is no abdominal tenderness. There is no right CVA tenderness, left CVA tenderness or guarding.  Musculoskeletal:        General: Normal range of motion.     Right lower leg: No edema.     Left lower leg: No edema.     Comments: Grossly normal range of motion of all 4 extremities, patient ambulatory without  assistance, patient able to flex and extend digits of bilateral upper and lower extremities  Bilateral upper and lower extremities are neurovascularly intact  Skin:    General: Skin is warm.     Capillary Refill: Capillary refill takes less than 2 seconds.     Comments: No appreciated rash, redness, or lesions to upper or lower extremities  Neurological:     General: No focal deficit present.     Mental Status: She is alert and oriented to person, place, and time.  Psychiatric:        Mood and Affect: Mood normal.        Behavior: Behavior normal. Behavior is cooperative.     (all labs ordered are listed, but only abnormal results are displayed) Labs Reviewed  BASIC METABOLIC PANEL WITH GFR - Abnormal; Notable for the following components:      Result Value   Glucose, Bld 127 (*)    All other components within normal limits  CBC WITH DIFFERENTIAL/PLATELET - Abnormal; Notable for the following components:   WBC 10.9 (*)    All other components within normal limits  URINALYSIS, ROUTINE W REFLEX MICROSCOPIC - Abnormal; Notable for the following components:   Hgb urine dipstick TRACE (*)    All other components within normal limits  URINALYSIS, MICROSCOPIC (REFLEX) - Abnormal; Notable for the following components:   Bacteria, UA RARE (*)    All other components within normal limits  HEPATIC FUNCTION PANEL - Abnormal; Notable for the following components:   Indirect Bilirubin 0.1 (*)    All other components within normal limits  PREGNANCY, URINE    EKG: None  Radiology: No results found.  {Document cardiac monitor, telemetry assessment procedure when appropriate:32947} Procedures   Medications Ordered in the ED  lidocaine  (LIDODERM ) 5 % 1 patch (1 patch Transdermal Patch Applied 06/19/24 2320)  ibuprofen  (ADVIL ) tablet 800 mg (800 mg Oral Given 06/19/24 2320)    Clinical Course as of 06/19/24 2356  Fri Jun 19, 2024  2255 Just on vitamins at home, on Vyvanse , Wegovy   previously since September, Non-alcoholic fatty liver disease [CH]  2256 Weight gain from 180-240, walks for exercise no current exercise [CH]    Clinical Course User Index [CH] Alonza Knisley, Terrall FALCON, PA-C   {Click here for ABCD2, HEART and other calculators REFRESH Note before signing:1}                              Medical Decision Making Amount and/or Complexity of Data Reviewed Labs: ordered.  Risk Prescription drug management.   ***  {Document critical care time when appropriate  Document review of labs and clinical decision tools ie CHADS2VASC2, etc  Document your independent review of radiology images and any outside records  Document your discussion with family members, caretakers and with consultants  Document social determinants of health affecting  pt's care  Document your decision making why or why not admission, treatments were needed:32947:::1}   Final diagnoses:  None    ED Discharge Orders     None        "

## 2024-06-19 NOTE — ED Notes (Signed)
 Called lab to run hepatic function panel off of blood sample sent to lab previously. Per lab, they will put this into process now.
